# Patient Record
Sex: Female | Born: 1980 | Race: White | Hispanic: No | Marital: Married | Smoking: Never smoker
Health system: Southern US, Community
[De-identification: ages and names within clinical notes are randomized; demographics above are authoritative.]

## PROBLEM LIST (undated history)

## (undated) DIAGNOSIS — F411 Generalized anxiety disorder: Secondary | ICD-10-CM

## (undated) DIAGNOSIS — E785 Hyperlipidemia, unspecified: Secondary | ICD-10-CM

## (undated) DIAGNOSIS — B999 Unspecified infectious disease: Secondary | ICD-10-CM

## (undated) DIAGNOSIS — T148XXA Other injury of unspecified body region, initial encounter: Secondary | ICD-10-CM

## (undated) DIAGNOSIS — O139 Gestational [pregnancy-induced] hypertension without significant proteinuria, unspecified trimester: Secondary | ICD-10-CM

## (undated) DIAGNOSIS — I00 Rheumatic fever without heart involvement: Secondary | ICD-10-CM

## (undated) HISTORY — DX: Rheumatic fever without heart involvement: I00

## (undated) HISTORY — DX: Hyperlipidemia, unspecified: E78.5

## (undated) HISTORY — DX: Generalized anxiety disorder: F41.1

## (undated) HISTORY — PX: OTHER SURGICAL HISTORY: SHX169

---

## 2007-09-02 ENCOUNTER — Other Ambulatory Visit: Admission: RE | Admit: 2007-09-02 | Discharge: 2007-09-02 | Payer: Self-pay | Admitting: Family Medicine

## 2008-09-08 ENCOUNTER — Other Ambulatory Visit: Admission: RE | Admit: 2008-09-08 | Discharge: 2008-09-08 | Payer: Self-pay | Admitting: Family Medicine

## 2009-05-10 ENCOUNTER — Ambulatory Visit (HOSPITAL_COMMUNITY): Admission: RE | Admit: 2009-05-10 | Discharge: 2009-05-10 | Payer: Self-pay | Admitting: Family Medicine

## 2009-07-11 ENCOUNTER — Ambulatory Visit: Payer: Self-pay | Admitting: Occupational Medicine

## 2009-07-11 DIAGNOSIS — F411 Generalized anxiety disorder: Secondary | ICD-10-CM | POA: Insufficient documentation

## 2009-07-11 DIAGNOSIS — I499 Cardiac arrhythmia, unspecified: Secondary | ICD-10-CM | POA: Insufficient documentation

## 2009-07-19 ENCOUNTER — Encounter: Payer: Self-pay | Admitting: Cardiology

## 2009-07-19 ENCOUNTER — Ambulatory Visit: Payer: Self-pay | Admitting: Cardiology

## 2009-07-19 DIAGNOSIS — R002 Palpitations: Secondary | ICD-10-CM

## 2009-07-19 DIAGNOSIS — R0602 Shortness of breath: Secondary | ICD-10-CM

## 2009-07-19 DIAGNOSIS — R079 Chest pain, unspecified: Secondary | ICD-10-CM

## 2009-08-11 ENCOUNTER — Ambulatory Visit (HOSPITAL_COMMUNITY): Admission: RE | Admit: 2009-08-11 | Discharge: 2009-08-11 | Payer: Self-pay | Admitting: Cardiology

## 2009-08-11 ENCOUNTER — Encounter: Payer: Self-pay | Admitting: Cardiology

## 2009-08-11 ENCOUNTER — Ambulatory Visit: Payer: Self-pay

## 2009-08-11 ENCOUNTER — Ambulatory Visit: Payer: Self-pay | Admitting: Cardiovascular Disease

## 2010-04-04 NOTE — Assessment & Plan Note (Signed)
Summary: [facility]   Visit Type:  Initial Consult  CC:  palpitations.  History of Present Illness: 30 yo female for evaluation of palpitations. No prior cardiac history. She typically does not have dyspnea on exertion, orthopnea, PND, pedal edema, syncope or exertional chest pain. Over the past 3 weeks she has noticed intermittent palpitations. They are described as a brief flutter. There is no associated presyncope or syncope. There is no associated chest pain. She also runs routinely. She has noticed exercise intolerance over that time mild dyspnea. She also has had an occasional pain in the right upper chest described as a sharp sensation. It does not radiate. It is not pleuritic or positional nor is it related to food. It is not exertional. It lasts several minutes to one hour. It resolved spontaneously. She was seen at urgent care and we were asked to further evaluate.  Current Medications (verified): 1)  Womens Multivitamin Plus  Tabs (Multiple Vitamins-Minerals) .... Take 1 Tablet By Mouth Once A Day 2)  Fish Oil Maximum Strength 1200 Mg Caps (Omega-3 Fatty Acids) .... Take 1 Capsule By Mouth Two Times A Day  Allergies (verified): No Known Drug Allergies  Past History:  Past Medical History: ANXIETY STATE, UNSPECIFIED (ICD-300.00) Hyperlipidemia Saw cardiologist at [facility] as a child for possible rheumatic fever  Past Surgical History: Wisdom teeth removal  Family History: Reviewed history from 07/11/2009 and no changes required. Mother, Healthy Father, Diabetes, HTN, Hyperlipidemia No premature CAD in immediate family  Social History: Reviewed history from 07/11/2009 and no changes required. 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 Remaining systems are negative.    Vital Signs:  Patient profile:   30 year old female Height:      68 inches Weight:      160.50 pounds BMI:     24.49 Pulse rate:   51 / minute Pulse rhythm:   regular Resp:     18 per minute BP sitting:   124 / 72  (left arm) Cuff size:   regular  Vitals Entered By: Vikki Ports (Jul 19, 2009 3:04 PM)  Physical Exam  General:  Well developed/well nourished in NAD Skin warm/dry Patient not depressed No peripheral clubbing Back-normal HEENT-normal/normal eyelids Neck supple/normal carotid upstroke bilaterally; no bruits; no JVD; no thyromegaly chest - CTA/ normal expansion CV - RRR/normal S1 and S2; no murmurs, rubs or gallops;  PMI nondisplaced Abdomen -NT/ND, no HSM, no mass, + bowel sounds, no bruit 2+ femoral pulses, no bruits Ext-no edema, chords, 2+ DP Neuro-grossly nonfocal     EKG  Procedure date:  07/19/2009  Findings:      NSR, RV conduction delay, no ST changes, no delta wave.  Impression & Recommendations:  Problem # 1:  PALPITATIONS (ICD-785.1) Etiology unclear but  sound most likely to be related to PACs or PVCs. Schedule TSH, magnesium and potassium. If symptoms worsen will consider a CardioNet in the future.  Problem # 2:  DYSPNEA (ICD-786.05) Will schedule echocardiogram particularly with question of rheumatic fever previously.   Problem # 3:  CHEST PAIN (ICD-786.50) Symptoms atypical and not likely cardiac. No further evaluation at this time. Note no risk factors for pulmonary embolus.  Other Orders: T-Basic Metabolic Panel [id number]) T-Magnesium [phone]) T-TSH [phone]) T-Pregnancy (Serum), Qual.  [phone]) Echocardiogram (Echo)  Patient Instructions: 1)  Your physician recommends that you schedule a follow-up appointment in: AS NEEDED PENDING TEST RESULTS 2)  Your physician has requested that you have an echocardiogram.  Echocardiography is a painless test that uses  sound waves to create images of your heart. It provides your doctor with information about the size and shape of your heart and how well your heart's chambers and valves are working.  This procedure takes approximately one hour. There are no restrictions for this procedure.

## 2010-04-04 NOTE — Assessment & Plan Note (Signed)
Summary: IRREGULAR HEART BEAT/KH   Vital Signs:  Patient Profile:   30 Years Old Female CC:      Irregular heartbeat, HTN x 2 weeks Height:     68 inches Weight:      155 pounds O2 Sat:      100 % O2 treatment:    Room Air Temp:     98.4 degrees F oral Pulse rate:   64 / minute Pulse rhythm:   regular Resp:     12 per minute BP sitting:   154 / 92  (right arm) Cuff size:   regular  Vitals Entered By: Emilio Math (Jul 11, 2009 3:00 PM)                  Current Allergies: No known allergies History of Present Illness Chief Complaint: Irregular heartbeat, HTN x 2 weeks History of Present Illness: Very healthy athletic 30 year old presents with complaints of irregular heartbeat for the last 2 weeks.   [name] is very very anxious and tearful about her symptoms.   Says for the last 2 weeks [name] noted that her "heart skips" on occasion and Her "blood pressure has been high".   [name] ran a 1/2 marathon 4 months ago and runs regularly.   [name] denies sensations of racing heartbeat, dizziness, syncope or shortness of breath.   [name] has been having atypical chest pain that [name] describes as "sharp" intermittently.   Tums relieved the chest pain earlier.   [name] has not tried tums again.     [name] says [name] has a remote cardiac history in that [name] saw a cardiologist once because of concerns about rheumatic fever.   [name] was never followed up and never took any medications or had any special precautions recommended.   [name] is very anxious and "worried I am going to die".   [name] denies any unusual stresses in her life.   REVIEW OF SYSTEMS Constitutional Symptoms      Denies fever, chills, night sweats, weight loss, weight gain, and fatigue.  Eyes       Denies change in vision, eye pain, eye discharge, glasses, contact lenses, and eye surgery. Ear/Nose/Throat/Mouth       Denies hearing loss/aids, change in hearing, ear pain, ear discharge, dizziness, frequent runny nose, frequent nose bleeds, sinus  problems, sore throat, hoarseness, and tooth pain or bleeding.  Respiratory       Denies dry cough, productive cough, wheezing, shortness of breath, asthma, bronchitis, and emphysema/COPD.  Cardiovascular       Denies murmurs, chest pain, and tires easily with exhertion.      Comments: Chest tightness   Gastrointestinal       Denies stomach pain, nausea/vomiting, diarrhea, constipation, blood in bowel movements, and indigestion. Genitourniary       Denies painful urination, kidney stones, and loss of urinary control. Neurological       Denies paralysis, seizures, and fainting/blackouts. Musculoskeletal       Denies muscle pain, joint pain, joint stiffness, decreased range of motion, redness, swelling, muscle weakness, and gout.  Skin       Denies bruising, unusual mles/lumps or sores, and hair/skin or nail changes.  Psych       Denies mood changes, temper/anger issues, anxiety/stress, speech problems, depression, and sleep problems.  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 General appearance: well developed, well nourished, no acute distress Ears: normal, no lesions or deformities Nasal: mucosa pink, nonedematous, no septal deviation, turbinates normal Neck: neck supple,  trachea midline, no masses Chest/Lungs: no rales, wheezes, or rhonchi bilateral, breath sounds equal without effort Heart: regular rate and  rhythm, no murmur Assessment New Problems: SINUS ARRHYTHMIA (ICD-427.9) ANXIETY STATE, UNSPECIFIED (ICD-300.00)   The patient and/or caregiver has been counseled thoroughly with regard to medications prescribed including dosage, schedule, interactions, rationale for use, and possible side effects and they verbalize understanding.  Diagnoses and  expected course of recovery discussed and will return if not improved as expected or if the condition worsens. Patient and/or caregiver verbalized understanding.   Patient Instructions: 1)  EKG showed marked sinus arrhythmia with a rate of 64.  Otherwise normal 2)  [name] does not have an urgent cardiac condition so I will have her follow up with Dr. Jens Som on May 17 at 3:00. 3)  No medications are recommended at this time.  4)  Reassurance given.

## 2010-04-11 ENCOUNTER — Other Ambulatory Visit: Payer: Self-pay | Admitting: Family Medicine

## 2010-04-11 ENCOUNTER — Other Ambulatory Visit (HOSPITAL_COMMUNITY)
Admission: RE | Admit: 2010-04-11 | Discharge: 2010-04-11 | Disposition: A | Discharge status: Home / Self Care | Payer: Managed Care, Other (non HMO) | Source: Ambulatory Visit | Attending: Family Medicine | Admitting: Family Medicine

## 2010-04-11 DIAGNOSIS — Z124 Encounter for screening for malignant neoplasm of cervix: Secondary | ICD-10-CM | POA: Insufficient documentation

## 2010-10-19 ENCOUNTER — Encounter: Payer: Self-pay | Admitting: Cardiology

## 2011-02-05 IMAGING — NM NM BONE 3 PHASE
1 series · 6 of 6 positions shown · non-contrast
Comparison: None.

CLINICAL DATA: Left hip pain, runner

NUCLEAR MEDICINE THREE PHASE BONE SCAN
TECHNIQUE: Radionuclide angiographic images, immediate static
blood pool images, and 3-hour delayed static images were obtained
after intravenous injection of radiopharmaceutical.
Radiopharmaceutical: 24.0 mCi technetium 99

[Series 1: fl flow and static · 9.41mm/px · 6 of 40 frames shown]
[frame 4/40]
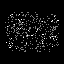
[frame 10/40  full-range]
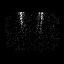
[frame 17/40  full-range]
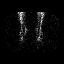
[frame 24/40  full-range]
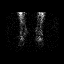
[frame 30/40  full-range]
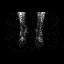
[frame 37/40  full-range]
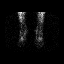

[6 of 6 positions shown; findings below may reference images not displayed]

FINDINGS: No asymmetric or increased flow to the left or right feet

No abnormal uptake on the blood pool phase.

No asymmetric or increased uptake on the delayed phase.
IMPRESSION: Normal three-phase bone scan of the feet.

## 2011-02-16 ENCOUNTER — Other Ambulatory Visit: Payer: Self-pay | Admitting: Surgery

## 2011-06-18 LAB — OB RESULTS CONSOLE GC/CHLAMYDIA: Gonorrhea: NEGATIVE

## 2011-07-19 LAB — OB RESULTS CONSOLE RUBELLA ANTIBODY, IGM: Rubella: IMMUNE

## 2011-07-19 LAB — OB RESULTS CONSOLE HEPATITIS B SURFACE ANTIGEN: Hepatitis B Surface Ag: NEGATIVE

## 2011-07-19 LAB — OB RESULTS CONSOLE ANTIBODY SCREEN: Antibody Screen: NEGATIVE

## 2011-07-19 LAB — OB RESULTS CONSOLE ABO/RH: RH Type: NEGATIVE

## 2012-01-24 LAB — OB RESULTS CONSOLE GBS: GBS: NEGATIVE

## 2012-02-18 ENCOUNTER — Encounter (HOSPITAL_COMMUNITY): Payer: Self-pay | Admitting: *Deleted

## 2012-02-18 ENCOUNTER — Inpatient Hospital Stay (HOSPITAL_COMMUNITY)
Admission: AD | Admit: 2012-02-18 | Discharge: 2012-02-18 | Disposition: A | Discharge status: Home / Self Care | Payer: BC Managed Care – PPO | Source: Ambulatory Visit | Attending: Obstetrics and Gynecology | Admitting: Obstetrics and Gynecology

## 2012-02-18 ENCOUNTER — Inpatient Hospital Stay (HOSPITAL_COMMUNITY): Payer: BC Managed Care – PPO

## 2012-02-18 DIAGNOSIS — O99891 Other specified diseases and conditions complicating pregnancy: Secondary | ICD-10-CM | POA: Insufficient documentation

## 2012-02-18 DIAGNOSIS — IMO0001 Reserved for inherently not codable concepts without codable children: Secondary | ICD-10-CM

## 2012-02-18 DIAGNOSIS — R030 Elevated blood-pressure reading, without diagnosis of hypertension: Secondary | ICD-10-CM | POA: Insufficient documentation

## 2012-02-18 LAB — COMPREHENSIVE METABOLIC PANEL
AST: 16 U/L (ref 0–37)
Albumin: 2.7 g/dL — ABNORMAL LOW (ref 3.5–5.2)
Alkaline Phosphatase: 122 U/L — ABNORMAL HIGH (ref 39–117)
BUN: 8 mg/dL (ref 6–23)
Calcium: 9.7 mg/dL (ref 8.4–10.5)
Creatinine, Ser: 0.6 mg/dL (ref 0.50–1.10)
GFR calc non Af Amer: >90 mL/min (ref >90)
Total Bilirubin: 0.2 mg/dL — ABNORMAL LOW (ref 0.3–1.2)
Total Protein: 6.2 g/dL (ref 6.0–8.3)

## 2012-02-18 LAB — URINALYSIS, ROUTINE W REFLEX MICROSCOPIC
Glucose, UA: NEGATIVE mg/dL
Hgb urine dipstick: NEGATIVE
Nitrite: NEGATIVE
Protein, ur: NEGATIVE mg/dL
Urobilinogen, UA: 0.2 mg/dL (ref 0.0–1.0)
pH: 5.5 (ref 5.0–8.0)

## 2012-02-18 LAB — CBC
HCT: 33.7 % — ABNORMAL LOW (ref 36.0–46.0)
Hemoglobin: 11.5 g/dL — ABNORMAL LOW (ref 12.0–15.0)
MCHC: 34.1 g/dL (ref 30.0–36.0)
RBC: 3.73 MIL/uL — ABNORMAL LOW (ref 3.87–5.11)

## 2012-02-18 LAB — URINE MICROSCOPIC-ADD ON

## 2012-02-18 LAB — PROTEIN / CREATININE RATIO, URINE
Creatinine, Urine: 19.8 mg/dL
Total Protein, Urine: <4 mg/dL

## 2012-02-18 NOTE — MAU Note (Signed)
Sent by MD of PIH eval

## 2012-02-18 NOTE — MAU Provider Note (Signed)
History     CSN: [id number]  Arrival date and time: 02/18/12 1728   None     Chief Complaint  Patient presents with  . Hypertension   HPI 31 y.o. G1P0 at 39 weeks sent from office for PIH eval, BP 140/90 there. Denies headache, vision change, abd pain. + fetal movement.   Past Medical History  Diagnosis Date  . Anxiety state     unspec  . HLD (hyperlipidemia)   . Rheumatic fever     saw cardiologist at [facility] as a child for possible rheumatic fever    Past Surgical History  Procedure Date  . Wisdom teeth removal   . No past surgeries     Family History  Problem Relation Age of Onset  . Hypertension Father   . Hyperlipidemia Father   . Diabetes Father     History  Substance Use Topics  . Smoking status: Unknown If Ever Smoked  . Smokeless tobacco: Not on file     Comment: non-smoker   . Alcohol Use: Yes    Allergies: No Known Allergies  Prescriptions prior to admission  Medication Sig Dispense Refill  . calcium carbonate (TUMS - DOSED IN MG ELEMENTAL CALCIUM) 500 MG chewable tablet Chew 1 tablet by mouth daily as needed. Heart burn      . docusate sodium (COLACE) 100 MG capsule Take 100 mg by mouth at bedtime.      . Prenatal Vit-Fe Fumarate-FA (PRENATAL MULTIVITAMIN) TABS Take 1 tablet by mouth at bedtime.        Review of Systems  Constitutional: Negative.   Eyes: Negative for blurred vision.  Respiratory: Negative.   Cardiovascular: Negative.   Gastrointestinal: Negative for nausea, vomiting, abdominal pain, diarrhea and constipation.  Genitourinary: Negative for dysuria, urgency, frequency, hematuria and flank pain.       Negative for vaginal bleeding, cramping/contractions  Musculoskeletal: Negative.   Neurological: Negative.  Negative for headaches.  Psychiatric/Behavioral: Negative.    Physical Exam   Blood pressure 125/81, pulse 64, temperature 98.5 F (36.9 C), temperature source Oral, resp. rate 16, height 5\' 6"  (1.676 m), weight 209  lb (94.802 kg).  Physical Exam  Nursing note and vitals reviewed. Constitutional: She is oriented to person, place, and time. She appears well-developed and well-nourished. No distress.  Cardiovascular: Normal rate.   Respiratory: Effort normal.  GI: Soft. There is no tenderness.  Musculoskeletal: Normal range of motion. She exhibits no edema.  Neurological: She is alert and oriented to person, place, and time. She has normal reflexes.  Skin: Skin is warm and dry.  Psychiatric: She has a normal mood and affect.    MAU Course  Procedures  Results for orders placed during the hospital encounter of 02/18/12 (from the past 72 hour(s))  URINALYSIS, ROUTINE W REFLEX MICROSCOPIC     Status: Abnormal   Collection Time   02/18/12  5:35 PM      Component Value Range Comment   Color, Urine STRAW (*) YELLOW    APPearance CLEAR  CLEAR    Specific Gravity, Urine <1.005 (*) 1.005 - 1.030    pH 5.5  5.0 - 8.0    Glucose, UA NEGATIVE  NEGATIVE mg/dL    Hgb urine dipstick NEGATIVE  NEGATIVE    Bilirubin Urine NEGATIVE  NEGATIVE    Ketones, ur NEGATIVE  NEGATIVE mg/dL    Protein, ur NEGATIVE  NEGATIVE mg/dL    Urobilinogen, UA 0.2  0.0 - 1.0 mg/dL    Nitrite NEGATIVE  NEGATIVE    Leukocytes, UA SMALL (*) NEGATIVE   URINE MICROSCOPIC-ADD ON     Status: Abnormal   Collection Time   02/18/12  5:35 PM      Component Value Range Comment   Squamous Epithelial / LPF MANY (*) RARE    WBC, UA 0-2  <3 WBC/hpf    RBC / HPF 0-2  <3 RBC/hpf    Bacteria, UA RARE  RARE   CBC     Status: Abnormal   Collection Time   02/18/12  6:25 PM      Component Value Range Comment   WBC 11.2 (*) 4.0 - 10.5 K/uL    RBC 3.73 (*) 3.87 - 5.11 MIL/uL    Hemoglobin 11.5 (*) 12.0 - 15.0 g/dL    HCT 47.8 (*) 29.5 - 46.0 %    MCV 90.3  78.0 - 100.0 fL    MCH 30.8  26.0 - 34.0 pg    MCHC 34.1  30.0 - 36.0 g/dL    RDW 62.1  30.8 - 65.7 %    Platelets 215  150 - 400 K/uL   COMPREHENSIVE METABOLIC PANEL     Status:  Abnormal   Collection Time   02/18/12  6:25 PM      Component Value Range Comment   Sodium 133 (*) 135 - 145 mEq/L    Potassium 3.8  3.5 - 5.1 mEq/L    Chloride 99  96 - 112 mEq/L    CO2 24  19 - 32 mEq/L    Glucose, Bld 73  70 - 99 mg/dL    BUN 8  6 - 23 mg/dL    Creatinine, Ser 8.46  0.50 - 1.10 mg/dL    Calcium 9.7  8.4 - 96.2 mg/dL    Total Protein 6.2  6.0 - 8.3 g/dL    Albumin 2.7 (*) 3.5 - 5.2 g/dL    AST 16  0 - 37 U/L    ALT 11  0 - 35 U/L    Alkaline Phosphatase 122 (*) 39 - 117 U/L    Total Bilirubin 0.2 (*) 0.3 - 1.2 mg/dL    GFR calc non Af Amer >90  >90 mL/min    GFR calc Af Amer >90  >90 mL/min    BPP 10/10 Assessment and Plan  Protein Creatinine ratio pending Care assumed by D. Charleen Kirks  [facility] 02/18/2012, 7:56 PM   C/W Dr. Renaldo Fiddler who reviewed labs (P:C ratio still pending)> Discharge home with preE precautions and F/U in office this week.  Danae Orleans, CNM 02/18/2012 8:52 PM

## 2012-02-22 ENCOUNTER — Inpatient Hospital Stay (HOSPITAL_COMMUNITY)
Admission: AD | Admit: 2012-02-22 | Discharge: 2012-02-25 | DRG: 371 | Disposition: A | Discharge status: Home / Self Care | Payer: BC Managed Care – PPO | Source: Ambulatory Visit | Attending: Obstetrics and Gynecology | Admitting: Obstetrics and Gynecology

## 2012-02-22 ENCOUNTER — Encounter (HOSPITAL_COMMUNITY): Payer: Self-pay | Admitting: *Deleted

## 2012-02-22 ENCOUNTER — Inpatient Hospital Stay (HOSPITAL_COMMUNITY): Payer: BC Managed Care – PPO

## 2012-02-22 DIAGNOSIS — I499 Cardiac arrhythmia, unspecified: Secondary | ICD-10-CM

## 2012-02-22 DIAGNOSIS — R079 Chest pain, unspecified: Secondary | ICD-10-CM

## 2012-02-22 DIAGNOSIS — F411 Generalized anxiety disorder: Secondary | ICD-10-CM

## 2012-02-22 DIAGNOSIS — R0602 Shortness of breath: Secondary | ICD-10-CM

## 2012-02-22 DIAGNOSIS — O139 Gestational [pregnancy-induced] hypertension without significant proteinuria, unspecified trimester: Secondary | ICD-10-CM | POA: Diagnosis present

## 2012-02-22 DIAGNOSIS — R002 Palpitations: Secondary | ICD-10-CM

## 2012-02-22 HISTORY — DX: Other injury of unspecified body region, initial encounter: T14.8XXA

## 2012-02-22 HISTORY — DX: Gestational (pregnancy-induced) hypertension without significant proteinuria, unspecified trimester: O13.9

## 2012-02-22 HISTORY — DX: Unspecified infectious disease: B99.9

## 2012-02-22 LAB — CBC
HCT: 35.1 % — ABNORMAL LOW (ref 36.0–46.0)
MCH: 30.5 pg (ref 26.0–34.0)
MCHC: 33.2 g/dL (ref 30.0–36.0)
MCV: 91.9 fL (ref 78.0–100.0)
Platelets: 204 10*3/uL (ref 150–400)
RBC: 4 MIL/uL (ref 3.87–5.11)

## 2012-02-22 LAB — COMPREHENSIVE METABOLIC PANEL
ALT: 12 U/L (ref 0–35)
Alkaline Phosphatase: 122 U/L — ABNORMAL HIGH (ref 39–117)
BUN: 9 mg/dL (ref 6–23)
CO2: 26 mEq/L (ref 19–32)
Calcium: 9.2 mg/dL (ref 8.4–10.5)
Chloride: 99 mEq/L (ref 96–112)
Creatinine, Ser: 0.68 mg/dL (ref 0.50–1.10)
GFR calc Af Amer: >90 mL/min (ref >90)
Glucose, Bld: 131 mg/dL — ABNORMAL HIGH (ref 70–99)
Total Bilirubin: 0.2 mg/dL — ABNORMAL LOW (ref 0.3–1.2)
Total Protein: 5.8 g/dL — ABNORMAL LOW (ref 6.0–8.3)

## 2012-02-22 LAB — URIC ACID: Uric Acid, Serum: 4.9 mg/dL (ref 2.4–7.0)

## 2012-02-22 LAB — TYPE AND SCREEN: ABO/RH(D): O NEG

## 2012-02-22 LAB — LACTATE DEHYDROGENASE: LDH: 134 U/L (ref 94–250)

## 2012-02-22 MED ORDER — TERBUTALINE SULFATE 1 MG/ML IJ SOLN: 0.2500 mg | Freq: Once | INTRAMUSCULAR | Status: AC | PRN

## 2012-02-22 MED ORDER — IBUPROFEN 600 MG PO TABS: 600.0000 mg | ORAL_TABLET | Freq: Four times a day (QID) | ORAL | Status: DC | PRN

## 2012-02-22 MED ORDER — LIDOCAINE HCL (PF) 1 % IJ SOLN: 30.0000 mL | INTRAMUSCULAR | Status: DC | PRN

## 2012-02-22 MED ORDER — PHENYLEPHRINE 40 MCG/ML (10ML) SYRINGE FOR IV PUSH (FOR BLOOD PRESSURE SUPPORT): 80.0000 ug | PREFILLED_SYRINGE | INTRAVENOUS | Status: DC | PRN

## 2012-02-22 MED ORDER — CITRIC ACID-SODIUM CITRATE 334-500 MG/5ML PO SOLN
30.0000 mL | ORAL | Status: DC | PRN
  Administered 2012-02-23: 30 mL via ORAL
  Filled 2012-02-22: qty 15

## 2012-02-22 MED ORDER — LACTATED RINGERS IV SOLN
INTRAVENOUS | Status: DC
  Administered 2012-02-22: 125 mL/h via INTRAVENOUS
  Administered 2012-02-22: 18:00:00 via INTRAVENOUS

## 2012-02-22 MED ORDER — OXYTOCIN 40 UNITS IN LACTATED RINGERS INFUSION - SIMPLE MED: 1.0000 m[IU]/min | INTRAVENOUS | Status: DC

## 2012-02-22 MED ORDER — ACETAMINOPHEN 325 MG PO TABS: 650.0000 mg | ORAL_TABLET | ORAL | Status: DC | PRN

## 2012-02-22 MED ORDER — LACTATED RINGERS IV SOLN: 500.0000 mL | Freq: Once | INTRAVENOUS | Status: DC

## 2012-02-22 MED ORDER — EPHEDRINE 5 MG/ML INJ: 10.0000 mg | INTRAVENOUS | Status: DC | PRN

## 2012-02-22 MED ORDER — OXYTOCIN 40 UNITS IN LACTATED RINGERS INFUSION - SIMPLE MED: 62.5000 mL/h | INTRAVENOUS | Status: DC

## 2012-02-22 MED ORDER — LACTATED RINGERS IV SOLN: 500.0000 mL | INTRAVENOUS | Status: DC | PRN

## 2012-02-22 MED ORDER — DIPHENHYDRAMINE HCL 50 MG/ML IJ SOLN: 12.5000 mg | INTRAMUSCULAR | Status: DC | PRN

## 2012-02-22 MED ORDER — EPHEDRINE 5 MG/ML INJ
10.0000 mg | INTRAVENOUS | Status: DC | PRN
  Filled 2012-02-22: qty 4

## 2012-02-22 MED ORDER — MISOPROSTOL 25 MCG QUARTER TABLET
25.0000 ug | ORAL_TABLET | ORAL | Status: DC | PRN
  Administered 2012-02-22: 25 ug via VAGINAL
  Filled 2012-02-22: qty 0.25

## 2012-02-22 MED ORDER — OXYTOCIN BOLUS FROM INFUSION: 500.0000 mL | INTRAVENOUS | Status: DC

## 2012-02-22 MED ORDER — PHENYLEPHRINE 40 MCG/ML (10ML) SYRINGE FOR IV PUSH (FOR BLOOD PRESSURE SUPPORT)
80.0000 ug | PREFILLED_SYRINGE | INTRAVENOUS | Status: DC | PRN
  Filled 2012-02-22: qty 5

## 2012-02-22 MED ORDER — OXYCODONE-ACETAMINOPHEN 5-325 MG PO TABS: 1.0000 | ORAL_TABLET | ORAL | Status: DC | PRN

## 2012-02-22 MED ORDER — ACETAMINOPHEN 325 MG PO TABS
650.0000 mg | ORAL_TABLET | Freq: Once | ORAL | Status: AC
  Administered 2012-02-22: 650 mg via ORAL
  Filled 2012-02-22: qty 2

## 2012-02-22 MED ORDER — ONDANSETRON HCL 4 MG/2ML IJ SOLN: 4.0000 mg | Freq: Four times a day (QID) | INTRAMUSCULAR | Status: DC | PRN

## 2012-02-22 MED ORDER — BUTORPHANOL TARTRATE 1 MG/ML IJ SOLN
1.0000 mg | INTRAMUSCULAR | Status: DC | PRN
  Administered 2012-02-22 – 2012-02-23 (×2): 1 mg via INTRAVENOUS
  Filled 2012-02-22 (×2): qty 1

## 2012-02-22 MED ORDER — ZOLPIDEM TARTRATE 5 MG PO TABS: 5.0000 mg | ORAL_TABLET | Freq: Every evening | ORAL | Status: DC | PRN

## 2012-02-22 MED ORDER — FENTANYL 2.5 MCG/ML BUPIVACAINE 1/10 % EPIDURAL INFUSION (WH - ANES)
14.0000 mL/h | INTRAMUSCULAR | Status: DC
  Filled 2012-02-22: qty 125

## 2012-02-22 NOTE — Progress Notes (Signed)
MD on the phone and notified FHR tracing baseline115 reactive and reassuring. Pt requesting to eat and MD stated pt could order dinner. Will continue to monitor.

## 2012-02-22 NOTE — MAU Note (Signed)
Sent from office for PIH eval, BP up, labs ok on Mon, has HA  And increased swelling.today, no visual changes or epigastric pain.

## 2012-02-22 NOTE — MAU Provider Note (Signed)
History     CSN: [id number]  Arrival date and time: 02/22/12 1347   First Provider Initiated Contact with Patient 02/22/12 1435      No chief complaint on file.  HPI  Pt is G1P0 @ [redacted]w[redacted]d  Here for PIH evaluation.  She was seen in the office on Monday and sent over for PIH eval on 12/16.  Pt has been taking BP at work and has been elevated diastolic in 90s.  Today she has a mild headache and some mild cramping; she denies visual changes or epigastric pain.  She was seen in the office today with elevated BP and was told she would come for PIH eval and probable induction.  Pt denies spotting, bleeding or LOF.  Past Medical History  Diagnosis Date  . Anxiety state     unspec  . HLD (hyperlipidemia)   . Rheumatic fever     saw cardiologist at [facility] as a child for possible rheumatic fever  . Pregnancy induced hypertension   . Infection     uti  . Fracture     ankle    Past Surgical History  Procedure Date  . Wisdom teeth removal   . No past surgeries     Family History  Problem Relation Age of Onset  . Hypertension Father   . Hyperlipidemia Father   . Diabetes Father   . Other Neg Hx     History  Substance Use Topics  . Smoking status: Never Smoker   . Smokeless tobacco: Never Used     Comment: non-smoker   . Alcohol Use: Yes     Comment: none with preg    Allergies: No Known Allergies  Prescriptions prior to admission  Medication Sig Dispense Refill  . acetaminophen (TYLENOL) 500 MG tablet Take 1,000 mg by mouth daily as needed. headache      . calcium carbonate (TUMS - DOSED IN MG ELEMENTAL CALCIUM) 500 MG chewable tablet Chew 1-2 tablets by mouth daily as needed. Heart burn      . docusate sodium (COLACE) 100 MG capsule Take 100 mg by mouth at bedtime.      . Prenatal Vit-Fe Fumarate-FA (PRENATAL MULTIVITAMIN) TABS Take 1 tablet by mouth at bedtime.        Review of Systems  Constitutional: Negative for fever and chills.  Gastrointestinal: Positive for  abdominal pain. Negative for nausea, vomiting, diarrhea and constipation.  Genitourinary: Negative for dysuria and urgency.   Physical Exam   Blood pressure 142/73, pulse 82, temperature 97 F (36.1 C), temperature source Oral, resp. rate 18.  Physical Exam  Vitals reviewed. Constitutional: She is oriented to person, place, and time. She appears well-developed and well-nourished. No distress.  HENT:  Head: Normocephalic.  Eyes: Pupils are equal, round, and reactive to light.  Neck: Normal range of motion. Neck supple.  Cardiovascular: Normal rate.   Respiratory: Effort normal.  GI: Soft. She exhibits no distension. There is no tenderness. There is no rebound and no guarding.       Uterine irritability noted; FHR reactive with baseline 150- pt had 2 variables  Musculoskeletal: Normal range of motion. She exhibits edema.       2-3+pitting edema in ankles  Neurological: She is alert and oriented to person, place, and time.  Skin: Skin is warm and dry.  Psychiatric: She has a normal mood and affect.    MAU Course  Procedures Tylenol ordered for headache Results for orders placed during the hospital encounter of  02/22/12 (from the past 24 hour(s))  CBC     Status: Abnormal   Collection Time   02/22/12  2:00 PM      Component Value Range   WBC 13.0 (*) 4.0 - 10.5 K/uL   RBC 3.82 (*) 3.87 - 5.11 MIL/uL   Hemoglobin 11.7 (*) 12.0 - 15.0 g/dL   HCT 16.1 (*) 09.6 - 04.5 %   MCV 91.9  78.0 - 100.0 fL   MCH 30.6  26.0 - 34.0 pg   MCHC 33.3  30.0 - 36.0 g/dL   RDW 40.9  81.1 - 91.4 %   Platelets 195  150 - 400 K/uL  COMPREHENSIVE METABOLIC PANEL     Status: Abnormal   Collection Time   02/22/12  2:00 PM      Component Value Range   Sodium 135  135 - 145 mEq/L   Potassium 4.1  3.5 - 5.1 mEq/L   Chloride 99  96 - 112 mEq/L   CO2 26  19 - 32 mEq/L   Glucose, Bld 131 (*) 70 - 99 mg/dL   BUN 9  6 - 23 mg/dL   Creatinine, Ser 7.82  0.50 - 1.10 mg/dL   Calcium 9.2  8.4 - 95.6 mg/dL     Total Protein 5.8 (*) 6.0 - 8.3 g/dL   Albumin 2.6 (*) 3.5 - 5.2 g/dL   AST 16  0 - 37 U/L   ALT 12  0 - 35 U/L   Alkaline Phosphatase 122 (*) 39 - 117 U/L   Total Bilirubin 0.2 (*) 0.3 - 1.2 mg/dL   GFR calc non Af Amer >90  >90 mL/min   GFR calc Af Amer >90  >90 mL/min  LACTATE DEHYDROGENASE     Status: Normal   Collection Time   02/22/12  2:00 PM      Component Value Range   LDH 134  94 - 250 U/L  URIC ACID     Status: Normal   Collection Time   02/22/12  2:00 PM      Component Value Range   Uric Acid, Serum 4.9  2.4 - 7.0 mg/dL  discussed labs with Dr. Henderson Cloud and fetal monitor strip- pt had one variable dec that lasted about 1 minute- will get BPP  Dr. Henderson Cloud here to see pt Assessment and Plan  Agree with above. After BPP of 8/8 and vertex was noted, FHT had another decel of about 1 minute. D/W patient and husband I believe she has evolving PIH and also repetitive decels Recommend delivery D/W patient and husband two stage induction process, length of process, risks including fetal distress, emergency cesarean section, failed induction and cesarean section. All questions answered.  LINEBERRY,SUSAN 02/22/2012, 3:07 PM

## 2012-02-22 NOTE — Progress Notes (Signed)
MD notified of FHR and decelerations. MD stated to not let pt eat at this time. Will continue to monitor.

## 2012-02-22 NOTE — Progress Notes (Signed)
SROM clear Cx 1/50/-2 per nurse check FHT-a decel noted earlier on a trip to [place]        Now FHT reactive BPs stable

## 2012-02-23 ENCOUNTER — Encounter (HOSPITAL_COMMUNITY): Payer: Self-pay | Admitting: Anesthesiology

## 2012-02-23 ENCOUNTER — Encounter (HOSPITAL_COMMUNITY): Payer: Self-pay | Admitting: Family Medicine

## 2012-02-23 ENCOUNTER — Inpatient Hospital Stay (HOSPITAL_COMMUNITY): Payer: BC Managed Care – PPO | Admitting: Anesthesiology

## 2012-02-23 ENCOUNTER — Encounter (HOSPITAL_COMMUNITY)
Admission: AD | Disposition: A | Discharge status: Home / Self Care | Payer: Self-pay | Source: Ambulatory Visit | Attending: Obstetrics and Gynecology

## 2012-02-23 LAB — CBC
MCH: 30.7 pg (ref 26.0–34.0)
MCHC: 33.8 g/dL (ref 30.0–36.0)
MCV: 90.8 fL (ref 78.0–100.0)
Platelets: 198 10*3/uL (ref 150–400)
RBC: 3.81 MIL/uL — ABNORMAL LOW (ref 3.87–5.11)
RDW: 13.2 % (ref 11.5–15.5)

## 2012-02-23 SURGERY — Surgical Case
Anesthesia: Spinal | Wound class: Clean Contaminated

## 2012-02-23 MED ORDER — EPHEDRINE SULFATE 50 MG/ML IJ SOLN
INTRAMUSCULAR | Status: DC | PRN
  Administered 2012-02-23: 10 mg via INTRAVENOUS

## 2012-02-23 MED ORDER — DIPHENHYDRAMINE HCL 50 MG/ML IJ SOLN: 12.5000 mg | INTRAMUSCULAR | Status: DC | PRN

## 2012-02-23 MED ORDER — SCOPOLAMINE 1 MG/3DAYS TD PT72
1.0000 | MEDICATED_PATCH | Freq: Once | TRANSDERMAL | Status: DC
  Administered 2012-02-23: 1.5 mg via TRANSDERMAL

## 2012-02-23 MED ORDER — CEFAZOLIN SODIUM-DEXTROSE 2-3 GM-% IV SOLR: 2.0000 g | INTRAVENOUS | Status: DC

## 2012-02-23 MED ORDER — CEFAZOLIN SODIUM-DEXTROSE 2-3 GM-% IV SOLR
INTRAVENOUS | Status: DC | PRN
  Administered 2012-02-23: 2 g via INTRAVENOUS

## 2012-02-23 MED ORDER — KETOROLAC TROMETHAMINE 30 MG/ML IJ SOLN: 30.0000 mg | Freq: Four times a day (QID) | INTRAMUSCULAR | Status: AC | PRN

## 2012-02-23 MED ORDER — LACTATED RINGERS IV SOLN
INTRAVENOUS | Status: DC | PRN
  Administered 2012-02-23: 02:00:00 via INTRAVENOUS

## 2012-02-23 MED ORDER — OXYTOCIN 40 UNITS IN LACTATED RINGERS INFUSION - SIMPLE MED: 62.5000 mL/h | INTRAVENOUS | Status: AC

## 2012-02-23 MED ORDER — PRENATAL MULTIVITAMIN CH
1.0000 | ORAL_TABLET | Freq: Every day | ORAL | Status: DC
  Administered 2012-02-23 – 2012-02-25 (×3): 1 via ORAL
  Filled 2012-02-23 (×3): qty 1

## 2012-02-23 MED ORDER — SIMETHICONE 80 MG PO CHEW: 80.0000 mg | CHEWABLE_TABLET | ORAL | Status: DC | PRN

## 2012-02-23 MED ORDER — NALBUPHINE HCL 10 MG/ML IJ SOLN
5.0000 mg | INTRAMUSCULAR | Status: DC | PRN
  Filled 2012-02-23: qty 1

## 2012-02-23 MED ORDER — DIPHENHYDRAMINE HCL 25 MG PO CAPS: 25.0000 mg | ORAL_CAPSULE | ORAL | Status: DC | PRN

## 2012-02-23 MED ORDER — IBUPROFEN 600 MG PO TABS
600.0000 mg | ORAL_TABLET | Freq: Four times a day (QID) | ORAL | Status: DC
  Administered 2012-02-23 – 2012-02-25 (×7): 600 mg via ORAL
  Filled 2012-02-23: qty 1

## 2012-02-23 MED ORDER — ONDANSETRON HCL 4 MG/2ML IJ SOLN
INTRAMUSCULAR | Status: AC
  Filled 2012-02-23: qty 2

## 2012-02-23 MED ORDER — SIMETHICONE 80 MG PO CHEW
80.0000 mg | CHEWABLE_TABLET | Freq: Three times a day (TID) | ORAL | Status: DC
  Administered 2012-02-23 – 2012-02-25 (×9): 80 mg via ORAL

## 2012-02-23 MED ORDER — ONDANSETRON HCL 4 MG PO TABS: 4.0000 mg | ORAL_TABLET | ORAL | Status: DC | PRN

## 2012-02-23 MED ORDER — PHENYLEPHRINE 40 MCG/ML (10ML) SYRINGE FOR IV PUSH (FOR BLOOD PRESSURE SUPPORT)
PREFILLED_SYRINGE | INTRAVENOUS | Status: AC
  Filled 2012-02-23: qty 5

## 2012-02-23 MED ORDER — FENTANYL CITRATE 0.05 MG/ML IJ SOLN
INTRAMUSCULAR | Status: AC
  Filled 2012-02-23: qty 2

## 2012-02-23 MED ORDER — LANOLIN HYDROUS EX OINT: 1.0000 "application " | TOPICAL_OINTMENT | CUTANEOUS | Status: DC | PRN

## 2012-02-23 MED ORDER — KETOROLAC TROMETHAMINE 60 MG/2ML IM SOLN
INTRAMUSCULAR | Status: AC
  Filled 2012-02-23: qty 2

## 2012-02-23 MED ORDER — EPHEDRINE 5 MG/ML INJ
INTRAVENOUS | Status: AC
  Filled 2012-02-23: qty 10

## 2012-02-23 MED ORDER — DIPHENHYDRAMINE HCL 50 MG/ML IJ SOLN: 25.0000 mg | INTRAMUSCULAR | Status: DC | PRN

## 2012-02-23 MED ORDER — LACTATED RINGERS IV SOLN
INTRAVENOUS | Status: DC
  Administered 2012-02-23: 08:00:00 via INTRAVENOUS

## 2012-02-23 MED ORDER — OXYCODONE-ACETAMINOPHEN 5-325 MG PO TABS
1.0000 | ORAL_TABLET | ORAL | Status: DC | PRN
  Administered 2012-02-24 – 2012-02-25 (×6): 1 via ORAL
  Filled 2012-02-23 (×6): qty 1

## 2012-02-23 MED ORDER — KETOROLAC TROMETHAMINE 60 MG/2ML IM SOLN
60.0000 mg | Freq: Once | INTRAMUSCULAR | Status: AC | PRN
  Administered 2012-02-23: 60 mg via INTRAMUSCULAR

## 2012-02-23 MED ORDER — HYDROMORPHONE HCL PF 1 MG/ML IJ SOLN: 0.2500 mg | INTRAMUSCULAR | Status: DC | PRN

## 2012-02-23 MED ORDER — SCOPOLAMINE 1 MG/3DAYS TD PT72
MEDICATED_PATCH | TRANSDERMAL | Status: AC
  Filled 2012-02-23: qty 1

## 2012-02-23 MED ORDER — NALOXONE HCL 1 MG/ML IJ SOLN
1.0000 ug/kg/h | INTRAVENOUS | Status: DC | PRN
  Filled 2012-02-23: qty 2

## 2012-02-23 MED ORDER — MEPERIDINE HCL 25 MG/ML IJ SOLN: 6.2500 mg | INTRAMUSCULAR | Status: DC | PRN

## 2012-02-23 MED ORDER — TETANUS-DIPHTH-ACELL PERTUSSIS 5-2.5-18.5 LF-MCG/0.5 IM SUSP: 0.5000 mL | Freq: Once | INTRAMUSCULAR | Status: DC

## 2012-02-23 MED ORDER — SENNOSIDES-DOCUSATE SODIUM 8.6-50 MG PO TABS
2.0000 | ORAL_TABLET | Freq: Every day | ORAL | Status: DC
  Administered 2012-02-23 – 2012-02-24 (×2): 2 via ORAL

## 2012-02-23 MED ORDER — ZOLPIDEM TARTRATE 5 MG PO TABS: 5.0000 mg | ORAL_TABLET | Freq: Every evening | ORAL | Status: DC | PRN

## 2012-02-23 MED ORDER — METOCLOPRAMIDE HCL 5 MG/ML IJ SOLN: 10.0000 mg | Freq: Three times a day (TID) | INTRAMUSCULAR | Status: DC | PRN

## 2012-02-23 MED ORDER — WITCH HAZEL-GLYCERIN EX PADS: 1.0000 "application " | MEDICATED_PAD | CUTANEOUS | Status: DC | PRN

## 2012-02-23 MED ORDER — DIBUCAINE 1 % RE OINT: 1.0000 "application " | TOPICAL_OINTMENT | RECTAL | Status: DC | PRN

## 2012-02-23 MED ORDER — IBUPROFEN 600 MG PO TABS
600.0000 mg | ORAL_TABLET | Freq: Four times a day (QID) | ORAL | Status: DC | PRN
  Filled 2012-02-23 (×6): qty 1

## 2012-02-23 MED ORDER — NALOXONE HCL 0.4 MG/ML IJ SOLN: 0.4000 mg | INTRAMUSCULAR | Status: DC | PRN

## 2012-02-23 MED ORDER — MORPHINE SULFATE 0.5 MG/ML IJ SOLN
INTRAMUSCULAR | Status: AC
  Filled 2012-02-23: qty 10

## 2012-02-23 MED ORDER — ONDANSETRON HCL 4 MG/2ML IJ SOLN: 4.0000 mg | INTRAMUSCULAR | Status: DC | PRN

## 2012-02-23 MED ORDER — OXYTOCIN 10 UNIT/ML IJ SOLN
INTRAMUSCULAR | Status: AC
  Filled 2012-02-23: qty 4

## 2012-02-23 MED ORDER — ONDANSETRON HCL 4 MG/2ML IJ SOLN: 4.0000 mg | Freq: Three times a day (TID) | INTRAMUSCULAR | Status: DC | PRN

## 2012-02-23 MED ORDER — DIPHENHYDRAMINE HCL 25 MG PO CAPS: 25.0000 mg | ORAL_CAPSULE | Freq: Four times a day (QID) | ORAL | Status: DC | PRN

## 2012-02-23 MED ORDER — MENTHOL 3 MG MT LOZG: 1.0000 | LOZENGE | OROMUCOSAL | Status: DC | PRN

## 2012-02-23 MED ORDER — GLYCOPYRROLATE 0.2 MG/ML IJ SOLN
INTRAMUSCULAR | Status: DC | PRN
  Administered 2012-02-23: 0.2 mg via INTRAVENOUS

## 2012-02-23 MED ORDER — ONDANSETRON HCL 4 MG/2ML IJ SOLN
INTRAMUSCULAR | Status: DC | PRN
  Administered 2012-02-23: 4 mg via INTRAVENOUS

## 2012-02-23 MED ORDER — OXYTOCIN 10 UNIT/ML IJ SOLN
40.0000 [IU] | INTRAVENOUS | Status: DC | PRN
  Administered 2012-02-23: 40 [IU] via INTRAVENOUS

## 2012-02-23 MED ORDER — SODIUM CHLORIDE 0.9 % IJ SOLN
3.0000 mL | INTRAMUSCULAR | Status: DC | PRN
  Administered 2012-02-23: 3 mL via INTRAVENOUS

## 2012-02-23 SURGICAL SUPPLY — 33 items
BENZOIN TINCTURE PRP APPL 2/3 (GAUZE/BANDAGES/DRESSINGS) ×2 IMPLANT
CLOTH BEACON ORANGE TIMEOUT ST (SAFETY) ×2 IMPLANT
CONTAINER PREFILL 10% NBF 15ML (MISCELLANEOUS) IMPLANT
DERMABOND ADVANCED (GAUZE/BANDAGES/DRESSINGS)
DERMABOND ADVANCED .7 DNX12 (GAUZE/BANDAGES/DRESSINGS) IMPLANT
DRAPE LG THREE QUARTER DISP (DRAPES) ×2 IMPLANT
DRESSING TELFA 8X3 (GAUZE/BANDAGES/DRESSINGS) IMPLANT
DRSG OPSITE 11X17.75 LRG (GAUZE/BANDAGES/DRESSINGS) ×2 IMPLANT
DRSG OPSITE POSTOP 4X10 (GAUZE/BANDAGES/DRESSINGS) ×2 IMPLANT
DURAPREP 26ML APPLICATOR (WOUND CARE) ×2 IMPLANT
ELECT REM PT RETURN 9FT ADLT (ELECTROSURGICAL) ×2
ELECTRODE REM PT RTRN 9FT ADLT (ELECTROSURGICAL) ×1 IMPLANT
EXTRACTOR VACUUM M CUP 4 TUBE (SUCTIONS) IMPLANT
GAUZE SPONGE 4X4 12PLY STRL LF (GAUZE/BANDAGES/DRESSINGS) ×4 IMPLANT
GLOVE BIO SURGEON STRL SZ8 (GLOVE) ×4 IMPLANT
GOWN PREVENTION PLUS LG XLONG (DISPOSABLE) ×2 IMPLANT
KIT ABG SYR 3ML LUER SLIP (SYRINGE) ×2 IMPLANT
NEEDLE HYPO 25X5/8 SAFETYGLIDE (NEEDLE) ×2 IMPLANT
NS IRRIG 1000ML POUR BTL (IV SOLUTION) ×2 IMPLANT
PACK C SECTION WH (CUSTOM PROCEDURE TRAY) ×2 IMPLANT
PAD ABD 7.5X8 STRL (GAUZE/BANDAGES/DRESSINGS) IMPLANT
PAD OB MATERNITY 4.3X12.25 (PERSONAL CARE ITEMS) ×2 IMPLANT
SLEEVE SCD COMPRESS KNEE MED (MISCELLANEOUS) ×2 IMPLANT
STAPLER VISISTAT 35W (STAPLE) IMPLANT
STRIP CLOSURE SKIN 1/2X4 (GAUZE/BANDAGES/DRESSINGS) IMPLANT
SUT MNCRL 0 VIOLET CTX 36 (SUTURE) ×4 IMPLANT
SUT MONOCRYL 0 CTX 36 (SUTURE) ×4
SUT PDS AB 0 CTX 60 (SUTURE) ×2 IMPLANT
SUT PLAIN 0 NONE (SUTURE) IMPLANT
SUT VIC AB 4-0 KS 27 (SUTURE) ×2 IMPLANT
TOWEL OR 17X24 6PK STRL BLUE (TOWEL DISPOSABLE) ×6 IMPLANT
TRAY FOLEY CATH 14FR (SET/KITS/TRAYS/PACK) ×2 IMPLANT
WATER STERILE IRR 1000ML POUR (IV SOLUTION) ×2 IMPLANT

## 2012-02-23 NOTE — Progress Notes (Signed)
FHT baseline 110, repetitive variable decels getting deeper now into 80s with slow recovery  Cx 2/90/-2 no change  UCs q2-3 min after one cytotec  A: Non reassuring FHT  P: recommend cesarean section     D/W patient and husband risks including infection, organ damage, bleeding/transfusion-HIV/Hep, DVT/PE, pneumonia. All questions answered.

## 2012-02-23 NOTE — Transfer of Care (Signed)
Immediate Anesthesia Transfer of Care Note  Patient: [name]  Procedure(s) Performed: Procedure(s) (LRB) with comments: CESAREAN SECTION (N/A) - Primary Cesarean Section  Patient Location: PACU  Anesthesia Type:Spinal  Level of Consciousness: awake, alert  and oriented  Airway & Oxygen Therapy: Patient Spontanous Breathing  Post-op Assessment: Report given to PACU RN and Post -op Vital signs reviewed and stable  Post vital signs: Reviewed and stable  Complications: No apparent anesthesia complications

## 2012-02-23 NOTE — Consult Note (Signed)
Neonatology Note:  Attendance at C-section:  I was asked to attend this primary C/S at term due to NRFHR. The mother is a G1P0 O neg, GBS neg with PIH, being induced. ROM 4 hours prior to delivery, fluid clear. Infant vigorous with good spontaneous cry and tone. Needed only minimal bulb suctioning. Ap 9/9. Lungs clear to ausc in DR. To CN to care of Pediatrician.  Anyra Kaufman, MD  

## 2012-02-23 NOTE — Progress Notes (Signed)
MD discussed risks and benefits of cesarean section delivery. Informed consents signed.

## 2012-02-23 NOTE — Anesthesia Postprocedure Evaluation (Signed)
  Anesthesia Post-op Note  Patient: [name]  Procedure(s) Performed: Procedure(s) (LRB) with comments: CESAREAN SECTION (N/A) - Primary Cesarean Section  Patient is awake, responsive, moving her legs, and has signs of resolution of her numbness. Pain and nausea are reasonably well controlled. Vital signs are stable and clinically acceptable. Oxygen saturation is clinically acceptable. There are no apparent anesthetic complications at this time. Patient is ready for discharge.

## 2012-02-23 NOTE — Op Note (Signed)
NAMEKERRYANN, [name] NO.:  [id number]  MEDICAL RECORD NO.:  [id number]  LOCATION:  9114                          FACILITY:  [facility]  PHYSICIAN:  Guy Sandifer. Henderson Cloud, M.D. DATE OF BIRTH:  [birth year]  DATE OF PROCEDURE:  02/23/2012 DATE OF DISCHARGE:                              OPERATIVE REPORT   PREOPERATIVE DIAGNOSES: 1. Intrauterine pregnancy at 40-0/7th weeks. 2. Pregnancy-induced hypertension. 3. Nonreassuring fetal heart tracing.  POSTOPERATIVE DIAGNOSES: 1. Intrauterine pregnancy at 40-0/7th weeks. 2. Pregnancy-induced hypertension. 3. Nonreassuring fetal heart tracing.  PROCEDURE:  Low-transverse cesarean section.  SURGEON:  Guy Sandifer. Henderson Cloud, MD  ANESTHESIA:  Spinal, Jean Rosenthal MD  ESTIMATED BLOOD LOSS:  600 mL.  FINDINGS:  Viable female infant, Apgars of 9 at 1 minute, 5 minutes, not yet recorded.  Arterial cord pH 7.36, weight pending.  SPECIMENS:  Placenta to Pathology.  INDICATIONS AND CONSENT:  This patient is a 31 year old married white female, G1, P0, who was admitted yesterday because of increasingly labile blood pressures for pregnancy-induced hypertension and induction of labor.  Cervix was closed and thick on admission.  Laboratories are normal for pregnancy and blood pressures remained stable.  After one application of Cytotec, she begins contracting and had spontaneous rupture of membranes for clear fluid.  She continues to contract regularly.  The baseline fetal heart rate was noted to decrease from 120- 130s to, 105-110 range.  The repetitive variable deceleration was increasingly deep to the 80s with slow recoveries.  The patient progresses to 2 cm dilation, 90% effaced, -2 station.  However over 1-2 hours, she makes no progress.  Due to the anticipated length of pregnancy with nonreassuring fetal heart tracing, cesarean section was recommended.  Potential risks and complications were discussed with the patient and her husband  preoperatively including not limited to, infection, organ damage, bleeding requiring transfusion of blood products with HIV and hepatitis acquisition, DVT, PE, pneumonia.  All questions were answered and consent was signed on the chart.  PROCEDURE IN DETAIL:  The patient was taken to the operating room, where she was identified, spinal anesthetic was placed, and she was placed in dorsal supine position with a 15 degree left lateral wedge.  Fetal heart rate was obtained in the 80s.  The patient was then prepped with Betadine.  Foley catheter was placed and the bladder was drained.  She was draped in the sterile fashion.  Time-out undertaken.  After testing for adequate spinal anesthesia, skin was entered through a Pfannenstiel incision and dissection is carried out in layers of the peritoneum. Peritoneum was incised and extended superiorly and inferiorly. Vesicouterine peritoneum was taken down cephalolaterally with the fingers, and a bladder flap was developed.  The bladder blade was placed.  Uterus was incised in a low transverse manner.  The uterine cavity was entered bluntly with a hemostat.  The uterine incision was extended cephalolaterally with the fingers.  Vertex was delivered. Remainder of the baby was delivered without difficulty.  Good cry and tone was noted.  Cord was clamped and cut.  The baby was handed to the awaiting pediatrics team.  Placenta was manually delivered intact and sent to Pathology.  Uterine cavity was clean.  Uterus was closed in 2 running locking imbricating layers of 0 Monocryl suture which achieves good hemostasis.  The anterior peritoneum was closed in a running fashion with 0 Monocryl suture which was also used to reapproximate the pyramidalis muscle in the midline.  Anterior rectus fascia was closed in running fashion with a 0 looped PDS suture.  Subcutaneous tissues were reapproximated with plain sutures and the skin was closed with a 4-0 Vicryl on a  Keith needle.  Dressings were applied.  All counts correct. The patient was transferred recovery room in stable condition.     Guy Sandifer Henderson Cloud, M.D.     JET/MEDQ  D:  02/23/2012  T:  02/23/2012  Job:  [id number]

## 2012-02-23 NOTE — Anesthesia Preprocedure Evaluation (Deleted)
Anesthesia Evaluation  Patient identified by MRN, date of birth, ID band Patient awake    Reviewed: Allergy & Precautions, H&P , Patient's Chart, lab work & pertinent test results  Airway Mallampati: II TM Distance: >3 FB Neck ROM: full    Dental  (+) Teeth Intact   Pulmonary  breath sounds clear to auscultation        Cardiovascular Rhythm:regular Rate:Normal     Neuro/Psych    GI/Hepatic   Endo/Other    Renal/GU      Musculoskeletal   Abdominal   Peds  Hematology   Anesthesia Other Findings       Reproductive/Obstetrics (+) Pregnancy                           Anesthesia Physical Anesthesia Plan  ASA: III  Anesthesia Plan: Spinal   Post-op Pain Management:    Induction:   Airway Management Planned:   Additional Equipment:   Intra-op Plan:   Post-operative Plan:   Informed Consent: I have reviewed the patients History and Physical, chart, labs and discussed the procedure including the risks, benefits and alternatives for the proposed anesthesia with the patient or authorized representative who has indicated his/her understanding and acceptance.   Dental Advisory Given  Plan Discussed with:   Anesthesia Plan Comments: (Labs checked- platelets confirmed with CRNA in room. Discussed spinal, and patient consents to the procedure:  included risk of possible headache,backache, failed block, allergic reaction, and nerve injury. This patient was asked if she had any questions or concerns before the procedure started. )       Anesthesia Quick Evaluation

## 2012-02-23 NOTE — Progress Notes (Signed)
Inserted by Dr Henderson Cloud

## 2012-02-23 NOTE — Anesthesia Postprocedure Evaluation (Signed)
  Anesthesia Post-op Note  Patient: [name]  Procedure(s) Performed: Procedure(s) (LRB) with comments: CESAREAN SECTION (N/A) - Primary Cesarean Section  Patient Location: PACU and Mother/Baby  Anesthesia Type:Spinal  Level of Consciousness: awake, alert  and oriented  Airway and Oxygen Therapy: Patient Spontanous Breathing    Post-op Assessment: Patient's Cardiovascular Status Stable and Respiratory Function Stable  Post-op Vital Signs: stable  Complications: No apparent anesthesia complications

## 2012-02-23 NOTE — Brief Op Note (Signed)
02/22/2012 - 02/23/2012  2:38 AM  PATIENT:  [name]  31 y.o. female  PRE-OPERATIVE DIAGNOSIS:  Non Reassuring fetal Heart Rate Tracing  POST-OPERATIVE DIAGNOSIS:  Same  PROCEDURE:  Procedure(s) (LRB) with comments: CESAREAN SECTION (N/A)  SURGEON:  Surgeon(s) and Role:    * Leslie Andrea, MD - Primary  PHYSICIAN ASSISTANT:   ASSISTANTS: none   ANESTHESIA:   spinal  EBL:  Total I/O In: 1000 [I.V.:1000] Out: -   BLOOD ADMINISTERED:none  DRAINS: Urinary Catheter (Foley)   LOCAL MEDICATIONS USED:  NONE  SPECIMEN:  Source of Specimen:  placenta  DISPOSITION OF SPECIMEN:  PATHOLOGY  COUNTS:  YES  TOURNIQUET:  * No tourniquets in log *  DICTATION: .Other Dictation: Dictation Number [id number]  PLAN OF CARE: Admit to inpatient   PATIENT DISPOSITION:  PACU - hemodynamically stable.   Delay start of Pharmacological VTE agent (>24hrs) due to surgical blood loss or risk of bleeding: not applicable

## 2012-02-23 NOTE — Anesthesia Procedure Notes (Signed)

## 2012-02-23 NOTE — Progress Notes (Signed)
MD at the bedside to evaluate FHR tracing, decelerations and SVE.

## 2012-02-23 NOTE — Progress Notes (Signed)
Subjective: Postpartum Day 1: Cesarean Delivery Patient reports tolerating PO.    Objective: Vital signs in last 24 hours: Temp:  [97 F (36.1 C)-98.2 F (36.8 C)] 97.5 F (36.4 C) (12/21 0748) Pulse Rate:  [51-88] 61  (12/21 0748) Resp:  [12-32] 16  (12/21 0748) BP: (114-144)/(18-85) 131/74 mmHg (12/21 0748) SpO2:  [95 %-100 %] 98 % (12/21 0748) Weight:  [209 lb (94.802 kg)] 209 lb (94.802 kg) (12/20 1808)  Physical Exam:  General: alert, cooperative and no distress Lochia: appropriate Uterine Fundus: firm Incision: healing well DVT Evaluation: No evidence of DVT seen on physical exam.   Basename 02/23/12 0105 02/22/12 1815  HGB 11.7* 12.2  HCT 34.6* 36.7    Assessment/Plan: Status post Cesarean section. Doing well postoperatively.  Continue current care.  Mataio Mele II,Montrelle Eddings E 02/23/2012, 8:23 AM

## 2012-02-23 NOTE — H&P (Signed)
[name] is a 31 y.o. female presenting for induction of labor secondary to PIH and decels (see MAU note). History OB History    Grav Para Term Preterm Abortions TAB SAB Ect Mult Living   1 1 1       1      Past Medical History  Diagnosis Date  . Anxiety state     unspec  . HLD (hyperlipidemia)   . Rheumatic fever     saw cardiologist at [facility] as a child for possible rheumatic fever  . Pregnancy induced hypertension   . Infection     uti  . Fracture     ankle   Past Surgical History  Procedure Date  . Wisdom teeth removal   . No past surgeries    Family History: family history includes Diabetes in her father; Hyperlipidemia in her father; and Hypertension in her father.  There is no history of Other. Social History:  reports that she has never smoked. She has never used smokeless tobacco. She reports that she drinks alcohol. She reports that she does not use illicit drugs.   Prenatal Transfer Tool  Maternal Diabetes: No Genetic Screening: Normal Maternal Ultrasounds/Referrals: Normal Fetal Ultrasounds or other Referrals:  None Maternal Substance Abuse:  No Significant Maternal Medications:  None Significant Maternal Lab Results:  None Other Comments:  None  Review of Systems  Eyes: Negative for blurred vision.  Gastrointestinal: Negative for abdominal pain.  Neurological: Negative for headaches.    Dilation: 2 Effacement (%): 90 Station: -2 Exam by:: Dr Henderson Cloud Blood pressure 128/72, pulse 56, temperature 97.8 F (36.6 C), temperature source Axillary, resp. rate 18, height 5\' 6"  (1.676 m), weight 209 lb (94.802 kg), SpO2 97.00%, unknown if currently breastfeeding.   Fetal Exam Fetal Monitor Review: Pattern: accelerations present.       Physical Exam  Cardiovascular: Normal rate and regular rhythm.   Respiratory: Effort normal and breath sounds normal.  GI: There is no tenderness.  Neurological: She has normal reflexes.    Prenatal labs: ABO, Rh:  --/--/O NEG, O NEG (12/20 1815) Antibody: NEG (12/20 1815) Rubella: Immune (05/16 0000) RPR: Nonreactive (05/16 0000)  HBsAg: Negative (05/16 0000)  HIV: Non-reactive (05/16 0000)  GBS: Negative (11/21 0000)   Assessment/Plan: 31 yo G1P0 at 39 6/7 weeks with PIH and non repetitive FHT decels D/W patient and husband two stage induction and risiks   [name] II,[name] E 02/23/2012, 9:53 AM

## 2012-02-24 LAB — CBC
HCT: 32.9 % — ABNORMAL LOW (ref 36.0–46.0)
MCH: 30.4 pg (ref 26.0–34.0)
MCHC: 33.1 g/dL (ref 30.0–36.0)
MCV: 91.9 fL (ref 78.0–100.0)
Platelets: 185 10*3/uL (ref 150–400)
RDW: 13.3 % (ref 11.5–15.5)
WBC: 11.2 10*3/uL — ABNORMAL HIGH (ref 4.0–10.5)

## 2012-02-24 NOTE — Anesthesia Preprocedure Evaluation (Signed)

## 2012-02-24 NOTE — Anesthesia Preprocedure Evaluation (Signed)
Anesthesia Evaluation Anesthesia Physical Anesthesia Plan Anesthesia Quick Evaluation  

## 2012-02-24 NOTE — Clinical Social Work Note (Signed)
CSW spoke with MOB about hx of anxiety.  MOB reports medication management over 10 years ago, mild symptoms but nothing of concern currently.  RN noted MOB seeming anxious about education.  CSW discussed MOB's desire to be a mother and both MOB and FOB expressed excitement about being new parents.  CSW instructed MOB to let RN or MD know if any concerns arise with anxiety, and did not feel like she needed feelings after birth brochure.  CSW also discussed family support at home.  MOB and FOB had family in room and reported no concerns at this time.  Please reconsult CSW if further needs arise.    [phone]

## 2012-02-24 NOTE — Progress Notes (Signed)
Subjective: Postpartum Day 1: Cesarean Delivery Patient reports tolerating PO, + flatus and no problems voiding.    Objective: Vital signs in last 24 hours: Temp:  [97.6 F (36.4 C)-98.9 F (37.2 C)] 98.4 F (36.9 C) (12/22 0700) Pulse Rate:  [56-70] 60  (12/22 0700) Resp:  [18-20] 18  (12/22 0700) BP: (105-139)/(62-79) 113/66 mmHg (12/22 0700) SpO2:  [97 %-98 %] 97 % (12/22 0300)  Physical Exam:  General: alert, cooperative and no distress Lochia: appropriate Uterine Fundus: firm Incision: healing well DVT Evaluation: No evidence of DVT seen on physical exam.   Basename 02/24/12 0600 02/23/12 0105  HGB 10.9* 11.7*  HCT 32.9* 34.6*    Assessment/Plan: Status post Cesarean section. Doing well postoperatively.  Continue current care.  Chike Farrington II,Reiana Poteet E 02/24/2012, 7:56 AM

## 2012-02-25 ENCOUNTER — Encounter (HOSPITAL_COMMUNITY): Payer: Self-pay | Admitting: Obstetrics and Gynecology

## 2012-02-25 MED ORDER — IBUPROFEN 600 MG PO TABS: 600.0000 mg | ORAL_TABLET | Freq: Four times a day (QID) | ORAL | Status: DC | PRN

## 2012-02-25 MED ORDER — OXYCODONE-ACETAMINOPHEN 5-325 MG PO TABS: 1.0000 | ORAL_TABLET | ORAL | Status: DC | PRN

## 2012-02-25 NOTE — Discharge Summary (Signed)
Obstetric Discharge Summary Reason for Admission: observation/evaluation Prenatal Procedures: ultrasound Intrapartum Procedures: cesarean: low cervical, transverse Postpartum Procedures: none Complications-Operative and Postpartum: none Hemoglobin  Date Value Range Status  02/24/2012 10.9* 12.0 - 15.0 g/dL Final  1/61/0960 45.4   Final     HCT  Date Value Range Status  02/24/2012 32.9* 36.0 - 46.0 % Final  07/19/2011 36   Final    Physical Exam:  General: alert and cooperative Lochia: appropriate Uterine Fundus: firm Incision: honeycomb dressing noted DVT Evaluation: No evidence of DVT seen on physical exam. Negative Homan's sign. No cords or calf tenderness. No significant calf/ankle edema.  Discharge Diagnoses: Term Pregnancy-delivered  Discharge Information: Date: 02/25/2012 Activity: pelvic rest Diet: routine Medications: PNV, Ibuprofen and Percocet Condition: stable Instructions: refer to practice specific booklet Discharge to: home   Newborn Data: Live born female  Birth Weight: 7 lb 1.9 oz (3230 g) APGAR: 9,   Home with mother.  Moksha Dorgan G 02/25/2012, 8:15 AM

## 2012-02-25 NOTE — Progress Notes (Signed)
Subjective: Postpartum Day 2: Cesarean Delivery Patient reports tolerating PO, + flatus and no problems voiding.  Denies HA,or blurred vision  Objective: Vital signs in last 24 hours: Temp:  [98 F (36.7 C)-98.2 F (36.8 C)] 98 F (36.7 C) (12/23 0610) Pulse Rate:  [66-81] 73  (12/23 0610) Resp:  [18] 18  (12/23 0610) BP: (120-137)/(68-85) 120/85 mmHg (12/23 0610)  Physical Exam:  General: alert and cooperative Lochia: appropriate Uterine Fundus: firm Incision: honeycomb dressing noted DVT Evaluation: No evidence of DVT seen on physical exam. Negative Homan's sign. No cords or calf tenderness. No significant calf/ankle edema.   Basename 02/24/12 0600 02/23/12 0105  HGB 10.9* 11.7*  HCT 32.9* 34.6*    Assessment/Plan: Status post Cesarean section. Doing well postoperatively.  Discharge home with standard precautions and return to clinic in 3 days.  Rayna Brenner G 02/25/2012, 8:04 AM

## 2012-02-28 ENCOUNTER — Encounter (HOSPITAL_COMMUNITY): Payer: Self-pay | Admitting: *Deleted

## 2012-04-19 ENCOUNTER — Other Ambulatory Visit: Payer: Self-pay

## 2012-04-22 ENCOUNTER — Ambulatory Visit (HOSPITAL_COMMUNITY)
Admission: RE | Admit: 2012-04-22 | Discharge: 2012-04-22 | Disposition: A | Discharge status: Home / Self Care | Payer: BC Managed Care – PPO | Source: Ambulatory Visit | Attending: Obstetrics and Gynecology | Admitting: Obstetrics and Gynecology

## 2012-04-22 NOTE — Lactation Note (Signed)
Adult Lactation Consultation Outpatient Visit Note  Patient Name: [name] Date of Birth: 18-Oct-1980 Gestational Age at Delivery: Unknown Type of Delivery:   Breastfeeding History: Frequency of Breastfeeding: q 3 hpurs Length of Feeding:30 minutes  Voids: QS Stools:QS   Supplementing / Method: Pumping:  Type of Pump:Medela- going back to work tomorrow   Frequency: 3-4 times/day  Volume:  3-6 oz  Comments:     Consultation Evaluation: Mom has had history of yeast. Mom treated with Monostat cream and Diflucin. Baby treated with Nystatin. Mom and baby much improved but baby still has occasional yeasty diaper rash.    Initial Feeding Assessment: Pre-feed Weight: 9- 14.7 4500g Post-feed Weight: 9-15.8  4532g Amount Transferred: 32 cc's Comments: Both of mom's nipples are still pink and mom reports they are burning and stinging after pumping or nursing.. Reports that baby is biting occasionally. Baby nursed for 15 minutes with good deep latch observed. and then off the breast. Had fed 2 hours ago  Additional Feeding Assessment: Pre-feed Weight:9- 15.8  4532g Post-feed Weight: 10-2.5  4606g Amount Transferred: 74 cc's Comments:Baby nursed on left breast for 15 minutes, then off to sleep. Mom pumped with her Medela pump. Encouraged to turn suction down a little bit. Used 21 flange and mom reports that feels a little better. SN shells given to keep her clothes off her nipples. Encouraged to rub breast milk into nipples after nursing or pumping. If no improvement with these measures to call OB back and may need additional med for yeast  Total Breast milk Transferred this Visit: 106 cc's Total Supplement Given: 0  Additional Interventions: No further questions at present. Mom reports that she has come to [facility] and it was very helpful..Discussed the night group and she may want to attend that group since she is going back to work.To call prn.  Follow-Up  PRN    Pamelia Hoit 04/22/2012, 4:57 PM

## 2012-12-10 ENCOUNTER — Ambulatory Visit (INDEPENDENT_AMBULATORY_CARE_PROVIDER_SITE_OTHER): Payer: 59 | Admitting: Family Medicine

## 2012-12-10 VITALS — BP 112/78 | HR 61 | Temp 98.4°F | Resp 20 | Ht 66.25 in | Wt 162.2 lb

## 2012-12-10 DIAGNOSIS — Z0000 Encounter for general adult medical examination without abnormal findings: Secondary | ICD-10-CM

## 2012-12-10 LAB — CBC
MCH: 31.3 pg (ref 26.0–34.0)
MCV: 92.9 fL (ref 78.0–100.0)
Platelets: 268 10*3/uL (ref 150–400)
RBC: 4.22 MIL/uL (ref 3.87–5.11)
RDW: 13.6 % (ref 11.5–15.5)
WBC: 5.3 10*3/uL (ref 4.0–10.5)

## 2012-12-10 LAB — LIPID PANEL
HDL: 53 mg/dL (ref >39)
LDL Cholesterol: 115 mg/dL — ABNORMAL HIGH (ref 0–99)
Total CHOL/HDL Ratio: 3.6 Ratio

## 2012-12-10 LAB — POCT URINALYSIS DIPSTICK
Leukocytes, UA: NEGATIVE
Nitrite, UA: NEGATIVE
Protein, UA: NEGATIVE
Urobilinogen, UA: 0.2
pH, UA: 6.5

## 2012-12-10 LAB — COMPREHENSIVE METABOLIC PANEL
ALT: 17 U/L (ref 0–35)
AST: 17 U/L (ref 0–37)
CO2: 30 mEq/L (ref 19–32)
Chloride: 103 mEq/L (ref 96–112)
Creat: 0.67 mg/dL (ref 0.50–1.10)
Sodium: 137 mEq/L (ref 135–145)
Total Bilirubin: 0.6 mg/dL (ref 0.3–1.2)
Total Protein: 7 g/dL (ref 6.0–8.3)

## 2012-12-10 LAB — POCT UA - MICROSCOPIC ONLY
Casts, Ur, LPF, POC: NEGATIVE
Crystals, Ur, HPF, POC: NEGATIVE
Yeast, UA: NEGATIVE

## 2012-12-10 LAB — HEMOGLOBIN A1C
Hgb A1c MFr Bld: 5.4 % (ref <5.7)
Mean Plasma Glucose: 108 mg/dL (ref <117)

## 2012-12-10 NOTE — Progress Notes (Signed)
Subjective:    Patient ID: [name], female    DOB: 08-30-1980, 32 y.o.   MRN: [id number]  This chart was scribed for Ethelda Chick, MD by Blanchard Kelch, ED Scribe. The patient was seen in room 13. Patient's care was started at 8:42 AM.   HPI  [name] is a 32 y.o. 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 Her last physical exam was in 2012. Her last pap smear was in February by [facility]. It was done right after she gave birth to her child. She had her last eye exam in August and wears contacts. She goes to the dentist every six months. She has never had a colonoscopy or mammogram. She has not yet had her flu shot but will get it at work. She was breastfeeding her child. She was on an IUD but got it taken out and is not currently on birth control. She does not believe she is currently pregnant. She has past surgical histories of wisdom teeth removal and cesarean section. She has a medical history of high cholesterol. She also had PVCs but they have subsided. Her dad has diabetes, hypertension, high cholesterol. Her mother has no pertinent medical history. There is no family history of heart attack, cancer, or stroke. She has been married 6 years and had a child 9 months ago. She does not smoke or use drugs. She runs about 20 miles a week. 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  She is an only child. She denies ringing in ears, dizziness, blurred vision, double vision, mouth sores, chest pain, current palpations, shortness of breath, neck pain, shoulder pain, numbness or tingling in extremities, leg swelling, dysuria, hematuria, frequency, urinary incontinence, constipation, diarrhea, heartburn, insomnia, depression, anxiety. Her pre-pregnancy weight was 152 and she is currently 162.  Past Medical History  Diagnosis Date  . Anxiety state     unspec  . Rheumatic fever     saw cardiologist at  [facility] as a child for possible rheumatic fever  . Pregnancy induced hypertension   . Infection     uti  . Fracture     ankle  . HLD (hyperlipidemia)    Past Surgical History  Procedure Laterality Date  . Wisdom teeth removal    . No past surgeries    . Cesarean section  02/23/2012    Procedure: CESAREAN SECTION;  Surgeon: Leslie Andrea, MD;  Location: [facility];  Service: Obstetrics;  Laterality: N/A;  Primary Cesarean Section   Family History  Problem Relation Age of Onset  . Hypertension Father   . Hyperlipidemia Father   . Diabetes Father   . Other Neg Hx    No Known Allergies   Review of Systems  All other systems reviewed and are negative.       Objective:   Physical Exam  Constitutional: She is oriented to person, place, and time. She appears well-developed and well-nourished. No distress.  HENT:  Head: Normocephalic and atraumatic.  Right Ear: External ear normal.  Left Ear: External ear normal.  Nose: Nose normal.  Mouth/Throat: Oropharynx is clear and moist.  Eyes: Conjunctivae and EOM are normal. Pupils are equal, round, and reactive to light.  Neck: Normal range of motion. Neck supple.  Thyroid slightly prominent without nodules.   Cardiovascular: Normal rate and regular rhythm.   No murmur heard. Pulmonary/Chest: Effort normal and breath  sounds normal. No respiratory distress.  Abdominal: Soft. Bowel sounds are normal. She exhibits no distension. There is no tenderness. There is no rebound and no guarding.  Musculoskeletal: Normal range of motion. She exhibits no edema.       Right shoulder: Normal.       Left shoulder: Normal.       Right elbow: Normal.      Left elbow: Normal.       Right wrist: Normal.       Left wrist: Normal.       Right hip: Normal.       Left hip: Normal.       Right knee: Normal.       Left knee: Normal.       Right ankle: Normal.       Left ankle: Normal.       Cervical back: Normal.       Thoracic back: Normal.        Lumbar back: Normal.       Right upper arm: Normal.       Left upper arm: Normal.       Right forearm: Normal.       Left forearm: Normal.       Right hand: Normal.       Left hand: Normal.       Right upper leg: Normal.       Left upper leg: Normal.       Right lower leg: Normal.       Left lower leg: Normal.       Right foot: Normal.       Left foot: Normal.  Lymphadenopathy:    She has no cervical adenopathy.  Neurological: She is alert and oriented to person, place, and time.  Skin: Skin is warm and dry.  Psychiatric: She has a normal mood and affect.          Assessment & Plan:   The encounter diagnosis was Routine general medical examination at a health care facility.  1. CPE; Anticipatory guidance provided. Pap smear UTD per gynecology.  Immunizations UTD.  Obtain labs.  Does not desire contraception; continue PNV.  Insurance form completed.

## 2012-12-11 ENCOUNTER — Encounter: Payer: Self-pay | Admitting: Family Medicine

## 2013-01-08 ENCOUNTER — Other Ambulatory Visit: Payer: Self-pay

## 2013-08-13 ENCOUNTER — Encounter (HOSPITAL_COMMUNITY): Payer: Self-pay | Admitting: Pharmacist

## 2013-08-19 ENCOUNTER — Encounter (HOSPITAL_COMMUNITY): Payer: Self-pay

## 2013-08-19 ENCOUNTER — Encounter (HOSPITAL_COMMUNITY)
Admission: RE | Admit: 2013-08-19 | Discharge: 2013-08-19 | Disposition: A | Discharge status: Home / Self Care | Payer: 59 | Source: Ambulatory Visit | Attending: Obstetrics and Gynecology | Admitting: Obstetrics and Gynecology

## 2013-08-19 LAB — CBC
HCT: 36.4 % (ref 36.0–46.0)
HEMOGLOBIN: 12.4 g/dL (ref 12.0–15.0)
MCH: 30.8 pg (ref 26.0–34.0)
MCHC: 34.1 g/dL (ref 30.0–36.0)
MCV: 90.3 fL (ref 78.0–100.0)
Platelets: 190 10*3/uL (ref 150–400)
RBC: 4.03 MIL/uL (ref 3.87–5.11)
RDW: 12.8 % (ref 11.5–15.5)
WBC: 8.9 10*3/uL (ref 4.0–10.5)

## 2013-08-19 LAB — RPR

## 2013-08-19 LAB — TYPE AND SCREEN
ABO/RH(D): O NEG
ANTIBODY SCREEN: NEGATIVE

## 2013-08-19 NOTE — Patient Instructions (Signed)
[place]  08/19/2013   Your procedure is scheduled on:  08/21/13  Enter through the Main Entrance of [facility] up the phone at the desk and dial [phone].   Call this number if you have problems the morning of surgery: [phone]   Remember:   Do not eat food:After Midnight.  Do not drink clear liquids: After Midnight.  Take these medicines the morning of surgery with A SIP OF WATER: NA   Do not wear jewelry, make-up or nail polish.  Do not wear lotions, powders, or perfumes. You may wear deodorant.  Do not shave 48 hours prior to surgery.  Do not bring valuables to the hospital.  [facility] is not   responsible for any belongings or valuables brought to the hospital.  Contacts, dentures or bridgework may not be worn into surgery.  Leave suitcase in the car. After surgery it may be brought to your room.  For patients admitted to the hospital, checkout time is 11:00 AM the day of              discharge.   Patients discharged the day of surgery will not be allowed to drive             home.  Name and phone number of your driver: NA  Special Instructions:      Please read over the following fact sheets that you were given:   Surgical Site Infection Prevention

## 2013-08-20 NOTE — H&P (Signed)
[name] is a 33 y.o. female presenting for repeat cesarean section. Maternal Medical History:  Fetal activity: Perceived fetal activity is normal.      OB History   Grav Para Term Preterm Abortions TAB SAB Ect Mult Living   2 1 1       1      Past Medical History  Diagnosis Date  . Anxiety state     unspec  . Rheumatic fever     saw cardiologist at [facility] as a child for possible rheumatic fever  . Pregnancy induced hypertension   . Infection     uti  . Fracture     ankle  . HLD (hyperlipidemia)    Past Surgical History  Procedure Laterality Date  . Wisdom teeth removal    . Cesarean section  02/23/2012    Procedure: CESAREAN SECTION;  Surgeon: Allena Katz, MD;  Location: [facility];  Service: Obstetrics;  Laterality: N/A;  Primary Cesarean Section   Family History: family history includes Diabetes in her father; Hyperlipidemia in her father; Hypertension in her father. There is no history of Other. Social History:  reports that she has never smoked. She has never used smokeless tobacco. She reports that she drinks alcohol. She reports that she does not use illicit drugs.   Prenatal Transfer Tool  Maternal Diabetes: No Genetic Screening: Normal Maternal Ultrasounds/Referrals: Normal Fetal Ultrasounds or other Referrals:  None Maternal Substance Abuse:  No Significant Maternal Medications:  None Significant Maternal Lab Results:  None Other Comments:  None  Review of Systems  Eyes: Negative for blurred vision.  Gastrointestinal: Negative for abdominal pain.  Neurological: Negative for headaches.      Last menstrual period 11/16/2012. Exam Physical Exam  Cardiovascular: Normal rate and regular rhythm.   Respiratory: Effort normal and breath sounds normal.  GI: Soft. There is no tenderness.  Neurological: She has normal reflexes.    Prenatal labs: ABO, Rh: --/--/O NEG (06/17 0855) Antibody: NEG (06/17 0855) Rubella:   RPR: NON REAC (06/17 0855)   HBsAg:    HIV:    GBS:     Assessment/Plan: 33 yo G2P1 at 23 5/7 weeks for repeat cesarean section. Risks reviewed including infection, organ damage, bleeding/transfusion-HIV/Hep, DVT/PE, pneumonia. All questions answered.   TOMBLIN II,JAMES E 08/20/2013, 6:01 PM

## 2013-08-21 ENCOUNTER — Encounter (HOSPITAL_COMMUNITY)
Admission: AD | Disposition: A | Discharge status: Home / Self Care | Payer: Self-pay | Source: Ambulatory Visit | Attending: Obstetrics and Gynecology

## 2013-08-21 ENCOUNTER — Encounter (HOSPITAL_COMMUNITY): Payer: Self-pay | Admitting: Anesthesiology

## 2013-08-21 ENCOUNTER — Encounter (HOSPITAL_COMMUNITY): Payer: 59 | Admitting: Anesthesiology

## 2013-08-21 ENCOUNTER — Inpatient Hospital Stay (HOSPITAL_COMMUNITY): Payer: 59 | Admitting: Anesthesiology

## 2013-08-21 ENCOUNTER — Inpatient Hospital Stay (HOSPITAL_COMMUNITY)
Admission: AD | Admit: 2013-08-21 | Discharge: 2013-08-23 | DRG: 766 | Disposition: A | Discharge status: Home / Self Care | Payer: 59 | Source: Ambulatory Visit | Attending: Obstetrics and Gynecology | Admitting: Obstetrics and Gynecology

## 2013-08-21 DIAGNOSIS — Z833 Family history of diabetes mellitus: Secondary | ICD-10-CM

## 2013-08-21 DIAGNOSIS — F411 Generalized anxiety disorder: Secondary | ICD-10-CM | POA: Diagnosis present

## 2013-08-21 DIAGNOSIS — O99344 Other mental disorders complicating childbirth: Secondary | ICD-10-CM | POA: Diagnosis present

## 2013-08-21 DIAGNOSIS — Z8249 Family history of ischemic heart disease and other diseases of the circulatory system: Secondary | ICD-10-CM

## 2013-08-21 DIAGNOSIS — Z98891 History of uterine scar from previous surgery: Secondary | ICD-10-CM

## 2013-08-21 DIAGNOSIS — O34219 Maternal care for unspecified type scar from previous cesarean delivery: Principal | ICD-10-CM | POA: Diagnosis present

## 2013-08-21 LAB — TYPE AND SCREEN
ABO/RH(D): O NEG
ANTIBODY SCREEN: NEGATIVE

## 2013-08-21 SURGERY — Surgical Case
Anesthesia: Spinal | Site: Abdomen

## 2013-08-21 MED ORDER — ONDANSETRON HCL 4 MG/2ML IJ SOLN: 4.0000 mg | Freq: Three times a day (TID) | INTRAMUSCULAR | Status: DC | PRN

## 2013-08-21 MED ORDER — SENNOSIDES-DOCUSATE SODIUM 8.6-50 MG PO TABS
2.0000 | ORAL_TABLET | ORAL | Status: DC
  Administered 2013-08-22 (×2): 2 via ORAL
  Filled 2013-08-21 (×2): qty 2

## 2013-08-21 MED ORDER — LANOLIN HYDROUS EX OINT: 1.0000 "application " | TOPICAL_OINTMENT | CUTANEOUS | Status: DC | PRN

## 2013-08-21 MED ORDER — SIMETHICONE 80 MG PO CHEW
80.0000 mg | CHEWABLE_TABLET | ORAL | Status: DC | PRN
  Administered 2013-08-22: 80 mg via ORAL

## 2013-08-21 MED ORDER — SODIUM CHLORIDE 0.9 % IJ SOLN: 3.0000 mL | INTRAMUSCULAR | Status: DC | PRN

## 2013-08-21 MED ORDER — ONDANSETRON HCL 4 MG/2ML IJ SOLN
4.0000 mg | INTRAMUSCULAR | Status: DC | PRN
Start: 2013-08-21 — End: 2013-08-23

## 2013-08-21 MED ORDER — FENTANYL CITRATE 0.05 MG/ML IJ SOLN
INTRAMUSCULAR | Status: AC
  Filled 2013-08-21: qty 2

## 2013-08-21 MED ORDER — PHENYLEPHRINE HCL 10 MG/ML IJ SOLN
INTRAMUSCULAR | Status: AC
  Filled 2013-08-21: qty 1

## 2013-08-21 MED ORDER — IBUPROFEN 600 MG PO TABS
600.0000 mg | ORAL_TABLET | Freq: Four times a day (QID) | ORAL | Status: DC
  Administered 2013-08-22 – 2013-08-23 (×7): 600 mg via ORAL
  Filled 2013-08-21 (×7): qty 1

## 2013-08-21 MED ORDER — SIMETHICONE 80 MG PO CHEW
80.0000 mg | CHEWABLE_TABLET | ORAL | Status: DC
  Administered 2013-08-22 (×2): 80 mg via ORAL
  Filled 2013-08-21 (×2): qty 1

## 2013-08-21 MED ORDER — DIPHENHYDRAMINE HCL 50 MG/ML IJ SOLN: 25.0000 mg | INTRAMUSCULAR | Status: DC | PRN

## 2013-08-21 MED ORDER — KETOROLAC TROMETHAMINE 30 MG/ML IJ SOLN
30.0000 mg | Freq: Four times a day (QID) | INTRAMUSCULAR | Status: AC | PRN
  Administered 2013-08-21: 30 mg via INTRAVENOUS
  Filled 2013-08-21: qty 1

## 2013-08-21 MED ORDER — ZOLPIDEM TARTRATE 5 MG PO TABS: 5.0000 mg | ORAL_TABLET | Freq: Every evening | ORAL | Status: DC | PRN

## 2013-08-21 MED ORDER — MORPHINE SULFATE 0.5 MG/ML IJ SOLN
INTRAMUSCULAR | Status: AC
  Filled 2013-08-21: qty 10

## 2013-08-21 MED ORDER — DIPHENHYDRAMINE HCL 25 MG PO CAPS
25.0000 mg | ORAL_CAPSULE | ORAL | Status: DC | PRN
  Filled 2013-08-21: qty 1

## 2013-08-21 MED ORDER — DIPHENHYDRAMINE HCL 25 MG PO CAPS: 25.0000 mg | ORAL_CAPSULE | Freq: Four times a day (QID) | ORAL | Status: DC | PRN

## 2013-08-21 MED ORDER — PRENATAL MULTIVITAMIN CH
1.0000 | ORAL_TABLET | Freq: Every day | ORAL | Status: DC
  Administered 2013-08-21 – 2013-08-22 (×2): 1 via ORAL
  Filled 2013-08-21 (×3): qty 1

## 2013-08-21 MED ORDER — ONDANSETRON HCL 4 MG PO TABS
4.0000 mg | ORAL_TABLET | ORAL | Status: DC | PRN
Start: 2013-08-21 — End: 2013-08-23

## 2013-08-21 MED ORDER — OXYCODONE-ACETAMINOPHEN 5-325 MG PO TABS
1.0000 | ORAL_TABLET | ORAL | Status: DC | PRN
  Administered 2013-08-22 (×4): 1 via ORAL
  Administered 2013-08-22: 2 via ORAL
  Administered 2013-08-22: 1 via ORAL
  Administered 2013-08-23 (×3): 2 via ORAL
  Filled 2013-08-21 (×2): qty 1
  Filled 2013-08-21: qty 2
  Filled 2013-08-21 (×2): qty 1
  Filled 2013-08-21 (×2): qty 2
  Filled 2013-08-21: qty 1
  Filled 2013-08-21: qty 2

## 2013-08-21 MED ORDER — SCOPOLAMINE 1 MG/3DAYS TD PT72
1.0000 | MEDICATED_PATCH | Freq: Once | TRANSDERMAL | Status: DC
Start: 2013-08-21 — End: 2013-08-21
  Administered 2013-08-21: 1.5 mg via TRANSDERMAL

## 2013-08-21 MED ORDER — CEFAZOLIN SODIUM-DEXTROSE 2-3 GM-% IV SOLR
2.0000 g | INTRAVENOUS | Status: AC
  Administered 2013-08-21: 2 g via INTRAVENOUS

## 2013-08-21 MED ORDER — OXYTOCIN 10 UNIT/ML IJ SOLN
40.0000 [IU] | INTRAVENOUS | Status: DC | PRN
  Administered 2013-08-21: 40 [IU] via INTRAVENOUS

## 2013-08-21 MED ORDER — MEPERIDINE HCL 25 MG/ML IJ SOLN: 6.2500 mg | INTRAMUSCULAR | Status: DC | PRN

## 2013-08-21 MED ORDER — DIBUCAINE 1 % RE OINT: 1.0000 "application " | TOPICAL_OINTMENT | RECTAL | Status: DC | PRN

## 2013-08-21 MED ORDER — CEFAZOLIN SODIUM-DEXTROSE 2-3 GM-% IV SOLR
INTRAVENOUS | Status: AC
  Filled 2013-08-21: qty 50

## 2013-08-21 MED ORDER — SODIUM CHLORIDE 0.9 % IJ SOLN
INTRAMUSCULAR | Status: AC
  Filled 2013-08-21: qty 20

## 2013-08-21 MED ORDER — 0.9 % SODIUM CHLORIDE (POUR BTL) OPTIME
TOPICAL | Status: DC | PRN
  Administered 2013-08-21: 1000 mL

## 2013-08-21 MED ORDER — TETANUS-DIPHTH-ACELL PERTUSSIS 5-2.5-18.5 LF-MCG/0.5 IM SUSP: 0.5000 mL | Freq: Once | INTRAMUSCULAR | Status: DC

## 2013-08-21 MED ORDER — NALBUPHINE HCL 10 MG/ML IJ SOLN: 5.0000 mg | INTRAMUSCULAR | Status: DC | PRN

## 2013-08-21 MED ORDER — SIMETHICONE 80 MG PO CHEW
80.0000 mg | CHEWABLE_TABLET | Freq: Three times a day (TID) | ORAL | Status: DC
  Administered 2013-08-21 – 2013-08-23 (×4): 80 mg via ORAL
  Filled 2013-08-21 (×5): qty 1

## 2013-08-21 MED ORDER — PHENYLEPHRINE 8 MG IN D5W 100 ML (0.08MG/ML) PREMIX OPTIME
INJECTION | INTRAVENOUS | Status: DC | PRN
  Administered 2013-08-21: 60 ug/min via INTRAVENOUS

## 2013-08-21 MED ORDER — WITCH HAZEL-GLYCERIN EX PADS: 1.0000 "application " | MEDICATED_PAD | CUTANEOUS | Status: DC | PRN

## 2013-08-21 MED ORDER — SCOPOLAMINE 1 MG/3DAYS TD PT72
1.0000 | MEDICATED_PATCH | Freq: Once | TRANSDERMAL | Status: DC
  Filled 2013-08-21: qty 1

## 2013-08-21 MED ORDER — FENTANYL CITRATE 0.05 MG/ML IJ SOLN
INTRAMUSCULAR | Status: DC | PRN
  Administered 2013-08-21: 15 ug via INTRATHECAL

## 2013-08-21 MED ORDER — BUPIVACAINE LIPOSOME 1.3 % IJ SUSP
20.0000 mL | Freq: Once | INTRAMUSCULAR | Status: AC
  Administered 2013-08-21: 20 mL
  Filled 2013-08-21: qty 20

## 2013-08-21 MED ORDER — BUPIVACAINE IN DEXTROSE 0.75-8.25 % IT SOLN
INTRATHECAL | Status: DC | PRN
  Administered 2013-08-21: 1.6 mL via INTRATHECAL

## 2013-08-21 MED ORDER — OXYTOCIN 40 UNITS IN LACTATED RINGERS INFUSION - SIMPLE MED: 62.5000 mL/h | INTRAVENOUS | Status: AC

## 2013-08-21 MED ORDER — LACTATED RINGERS IV SOLN
INTRAVENOUS | Status: DC
  Administered 2013-08-21 (×4): via INTRAVENOUS

## 2013-08-21 MED ORDER — ONDANSETRON HCL 4 MG/2ML IJ SOLN
INTRAMUSCULAR | Status: DC | PRN
  Administered 2013-08-21: 4 mg via INTRAVENOUS

## 2013-08-21 MED ORDER — LACTATED RINGERS IV SOLN
INTRAVENOUS | Status: DC
  Administered 2013-08-22: via INTRAVENOUS

## 2013-08-21 MED ORDER — PHENYLEPHRINE 40 MCG/ML (10ML) SYRINGE FOR IV PUSH (FOR BLOOD PRESSURE SUPPORT)
PREFILLED_SYRINGE | INTRAVENOUS | Status: AC
Start: 2013-08-21 — End: 2013-08-21
  Filled 2013-08-21: qty 5

## 2013-08-21 MED ORDER — DIPHENHYDRAMINE HCL 50 MG/ML IJ SOLN
INTRAMUSCULAR | Status: AC
  Administered 2013-08-21: 12.5 mg via INTRAVENOUS
  Filled 2013-08-21: qty 1

## 2013-08-21 MED ORDER — PHENYLEPHRINE HCL 10 MG/ML IJ SOLN
INTRAMUSCULAR | Status: DC | PRN
  Administered 2013-08-21: 40 ug via INTRAVENOUS

## 2013-08-21 MED ORDER — METOCLOPRAMIDE HCL 5 MG/ML IJ SOLN: 10.0000 mg | Freq: Three times a day (TID) | INTRAMUSCULAR | Status: DC | PRN

## 2013-08-21 MED ORDER — FENTANYL CITRATE 0.05 MG/ML IJ SOLN: 25.0000 ug | INTRAMUSCULAR | Status: DC | PRN

## 2013-08-21 MED ORDER — NALOXONE HCL 0.4 MG/ML IJ SOLN: 0.4000 mg | INTRAMUSCULAR | Status: DC | PRN

## 2013-08-21 MED ORDER — LACTATED RINGERS IV SOLN
INTRAVENOUS | Status: DC | PRN
  Administered 2013-08-21: 10:00:00 via INTRAVENOUS

## 2013-08-21 MED ORDER — DIPHENHYDRAMINE HCL 50 MG/ML IJ SOLN
12.5000 mg | INTRAMUSCULAR | Status: DC | PRN
  Administered 2013-08-21: 12.5 mg via INTRAVENOUS

## 2013-08-21 MED ORDER — MORPHINE SULFATE (PF) 0.5 MG/ML IJ SOLN
INTRAMUSCULAR | Status: DC | PRN
  Administered 2013-08-21: .1 mg via INTRATHECAL

## 2013-08-21 MED ORDER — ONDANSETRON HCL 4 MG/2ML IJ SOLN
INTRAMUSCULAR | Status: AC
  Filled 2013-08-21: qty 2

## 2013-08-21 MED ORDER — SCOPOLAMINE 1 MG/3DAYS TD PT72
MEDICATED_PATCH | TRANSDERMAL | Status: AC
  Filled 2013-08-21: qty 1

## 2013-08-21 MED ORDER — KETOROLAC TROMETHAMINE 30 MG/ML IJ SOLN: 30.0000 mg | Freq: Four times a day (QID) | INTRAMUSCULAR | Status: AC | PRN

## 2013-08-21 MED ORDER — NALOXONE HCL 1 MG/ML IJ SOLN
1.0000 ug/kg/h | INTRAMUSCULAR | Status: DC | PRN
  Filled 2013-08-21: qty 2

## 2013-08-21 MED ORDER — MENTHOL 3 MG MT LOZG: 1.0000 | LOZENGE | OROMUCOSAL | Status: DC | PRN

## 2013-08-21 MED ORDER — OXYTOCIN 10 UNIT/ML IJ SOLN
INTRAMUSCULAR | Status: AC
  Filled 2013-08-21: qty 4

## 2013-08-21 SURGICAL SUPPLY — 34 items
BENZOIN TINCTURE PRP APPL 2/3 (GAUZE/BANDAGES/DRESSINGS) ×3 IMPLANT
CLAMP CORD UMBIL (MISCELLANEOUS) IMPLANT
CLOSURE WOUND 1/2 X4 (GAUZE/BANDAGES/DRESSINGS) ×1
CLOTH BEACON ORANGE TIMEOUT ST (SAFETY) ×3 IMPLANT
CONTAINER PREFILL 10% NBF 15ML (MISCELLANEOUS) IMPLANT
DERMABOND ADVANCED (GAUZE/BANDAGES/DRESSINGS)
DERMABOND ADVANCED .7 DNX12 (GAUZE/BANDAGES/DRESSINGS) IMPLANT
DRAPE LG THREE QUARTER DISP (DRAPES) IMPLANT
DRSG OPSITE POSTOP 4X10 (GAUZE/BANDAGES/DRESSINGS) ×3 IMPLANT
DURAPREP 26ML APPLICATOR (WOUND CARE) ×3 IMPLANT
ELECT REM PT RETURN 9FT ADLT (ELECTROSURGICAL) ×3
ELECTRODE REM PT RTRN 9FT ADLT (ELECTROSURGICAL) ×1 IMPLANT
EXTRACTOR VACUUM M CUP 4 TUBE (SUCTIONS) IMPLANT
EXTRACTOR VACUUM M CUP 4' TUBE (SUCTIONS)
GLOVE BIO SURGEON STRL SZ8 (GLOVE) ×3 IMPLANT
GOWN STRL REUS W/TWL LRG LVL3 (GOWN DISPOSABLE) ×6 IMPLANT
KIT ABG SYR 3ML LUER SLIP (SYRINGE) ×3 IMPLANT
NEEDLE HYPO 21X1.5 SAFETY (NEEDLE) ×3 IMPLANT
NEEDLE HYPO 25X1 1.5 SAFETY (NEEDLE) ×3 IMPLANT
NEEDLE HYPO 25X5/8 SAFETYGLIDE (NEEDLE) ×3 IMPLANT
NS IRRIG 1000ML POUR BTL (IV SOLUTION) ×3 IMPLANT
PACK C SECTION WH (CUSTOM PROCEDURE TRAY) ×3 IMPLANT
PAD OB MATERNITY 4.3X12.25 (PERSONAL CARE ITEMS) ×3 IMPLANT
STAPLER VISISTAT 35W (STAPLE) IMPLANT
STRIP CLOSURE SKIN 1/2X4 (GAUZE/BANDAGES/DRESSINGS) ×2 IMPLANT
SUT MNCRL 0 VIOLET CTX 36 (SUTURE) ×4 IMPLANT
SUT MONOCRYL 0 CTX 36 (SUTURE) ×8
SUT PDS AB 0 CTX 60 (SUTURE) ×3 IMPLANT
SUT PLAIN 0 NONE (SUTURE) IMPLANT
SUT VIC AB 4-0 KS 27 (SUTURE) ×3 IMPLANT
SYR 20CC LL (SYRINGE) ×6 IMPLANT
TOWEL OR 17X24 6PK STRL BLUE (TOWEL DISPOSABLE) ×3 IMPLANT
TRAY FOLEY CATH 14FR (SET/KITS/TRAYS/PACK) ×3 IMPLANT
WATER STERILE IRR 1000ML POUR (IV SOLUTION) ×3 IMPLANT

## 2013-08-21 NOTE — Brief Op Note (Signed)
08/21/2013  10:45 AM  PATIENT:  [name]  33 y.o. female  PRE-OPERATIVE DIAGNOSIS:  PREVIOUS X 1  POST-OPERATIVE DIAGNOSIS:  PREVIOUS X 1  PROCEDURE:  Procedure(s) with comments: REPEAT CESAREAN SECTION (N/A) - REPEAT EDC 08/23/13  SURGEON:  Surgeon(s) and Role:    * Allena Katz, MD - Primary  PHYSICIAN ASSISTANT:   ASSISTANTS: none   ANESTHESIA:   spinal  EBL:  Total I/O In: 2100 [I.V.:2100] Out: -   BLOOD ADMINISTERED:none  DRAINS: Urinary Catheter (Foley)   LOCAL MEDICATIONS USED:  Amount: 20 ml in 27ml of saline and OTHER Exparel  SPECIMEN:  Source of Specimen:  placenta  DISPOSITION OF SPECIMEN:  PATHOLOGY  COUNTS:  YES  TOURNIQUET:  * No tourniquets in log *  DICTATION: .Other Dictation: Dictation Number [id number]  PLAN OF CARE: Admit to inpatient   PATIENT DISPOSITION:  PACU - hemodynamically stable.   Delay start of Pharmacological VTE agent (>24hrs) due to surgical blood loss or risk of bleeding: not applicable

## 2013-08-21 NOTE — Progress Notes (Signed)
No changes to H&P per patient history Reviewed with patient procedure-repeat cesarean section All questions answered

## 2013-08-21 NOTE — Lactation Note (Signed)
This note was copied from the chart of [place]. Lactation Consultation Note  Patient Name: [name] [id number] Date: 08/21/2013 Reason for consult: Initial assessment Baby 7 hours of life. Mom reports baby is very sleepy at breast. Enc mom to offer lots of STS, and to nurse with cues, at least 8-12 times/24 hours. Mom states she is able to hand express colostrum already. Mom states that she breastfed first child for 8 months, weaning just as she found out she was pregnant with this baby. Mom given [facility] brochure, aware of OP/BFSG and community resources. Enc mom to call out for assistance with latching baby as needed.   Maternal Data Has patient been taught Hand Expression?: Yes Does the patient have breastfeeding experience prior to this delivery?: Yes  Feeding    LATCH Score/Interventions Latch:  (Mom getting up to ambulate to bathroom first time since C/S.)              Intervention(s): Breastfeeding basics reviewed     Lactation Tools Discussed/Used     Consult Status Consult Status: Follow-up Date: 08/22/13 Follow-up type: In-patient    Inocente Salles 08/21/2013, 6:00 PM

## 2013-08-21 NOTE — Anesthesia Preprocedure Evaluation (Signed)
Anesthesia Evaluation  Patient identified by MRN, date of birth, ID band Patient awake    Reviewed: Allergy & Precautions, H&P , Patient's Chart, lab work & pertinent test results  Airway Mallampati: II TM Distance: >3 FB Neck ROM: full    Dental no notable dental hx.    Pulmonary  breath sounds clear to auscultation  Pulmonary exam normal       Cardiovascular Exercise Tolerance: Good hypertension, Rhythm:regular Rate:Normal     Neuro/Psych    GI/Hepatic   Endo/Other    Renal/GU      Musculoskeletal   Abdominal   Peds  Hematology   Anesthesia Other Findings   Reproductive/Obstetrics                           Anesthesia Physical Anesthesia Plan  ASA: II  Anesthesia Plan: Spinal   Post-op Pain Management:    Induction:   Airway Management Planned:   Additional Equipment:   Intra-op Plan:   Post-operative Plan:   Informed Consent: I have reviewed the patients History and Physical, chart, labs and discussed the procedure including the risks, benefits and alternatives for the proposed anesthesia with the patient or authorized representative who has indicated his/her understanding and acceptance.   Dental Advisory Given  Plan Discussed with: CRNA  Anesthesia Plan Comments: (Lab work confirmed with CRNA in room. Platelets okay. Discussed spinal anesthetic, and patient consents to the procedure:  included risk of possible headache,backache, failed block, allergic reaction, and nerve injury. This patient was asked if she had any questions or concerns before the procedure started. )        Anesthesia Quick Evaluation

## 2013-08-21 NOTE — Op Note (Signed)
[name], [name] NO.:  [id number]  MEDICAL RECORD NO.:  [id number]  LOCATION:  [facility]                          FACILITY:  [place]  PHYSICIAN:  Daleen Bo. Gaetano Net, M.D. DATE OF BIRTH:  [birth year]  DATE OF PROCEDURE:  08/21/2013 DATE OF DISCHARGE:                              OPERATIVE REPORT   PREOPERATIVE DIAGNOSIS:  Previous cesarean section, desires repeat.  POSTOPERATIVE DIAGNOSIS:  Previous cesarean section, desires repeat.  PROCEDURE:  Repeat low-transverse cesarean section.  SURGEON:  Daleen Bo. Gaetano Net, M.D.  ANESTHESIA:  Spinal, Midge Minium, M.D.  ESTIMATED BLOOD LOSS:  500 mL.  SPECIMENS:  Placenta to Pathology.  FINDINGS:  Viable female infant.  Apgars, weight, and arterial cord pH pending at the time of dictation.  INDICATIONS AND CONSENT:  This patient is a 33 year old married white female, G2, P1, at 65 and 5/7th weeks, with previous cesarean section. She desires repeat.  Procedure was discussed preoperatively.  Potential risks and complications were reviewed preoperatively including, but not limited to, infection, organ damage, bleeding requiring transfusion of blood products with HIV and hepatitis acquisition, DVT, PE, pneumonia. All questions were answered and consent was signed on the chart.  PROCEDURE:  The patient was taken to the operating room where she was identified, spinal anesthetic was placed and she was placed in a dorsal supine position with a 15-degree left lateral wedge.  Foley catheter was placed.  She was prepped and draped per [facility] protocol.  Time- out undertaken.  After testing for adequate spinal anesthesia, skin was entered through the previous Pfannenstiel scar and dissection was carried out in layers to the peritoneum.  Peritoneum was incised extended superiorly and inferiorly.  Vesicouterine peritoneum was taken down cephalad laterally.  The bladder flap was developed and the bladder blade was  placed.  Uterus was incised in a low transverse manner.  The uterine cavity was entered bluntly with a hemostat and clear fluid was noted.  The uterine incision was extended cephalad laterally with the fingers.  Vertex was delivered without difficulty.  A loose nuchal cord x1 was reduced.  Baby was delivered and good cry and tone was noted. Cord was clamped and cut.  The baby was handed to awaiting pediatrics team.  Placenta was manually delivered and the cavity was clean.  Uterus was closed in a running, locking fashion with 0-Monocryl suture, which achieved good hemostasis.  Anterior peritoneum was closed in a running fashion with 0-Monocryl suture, which was also used to reapproximate the pyramidalis muscle in the midline.  Anterior rectus fascia was closed in a running fashion with a 0-PDS suture.  20 mL of Exparel in 20 mL of normal saline was then injected both subfascially and subcutaneously. Subcutaneous tissue was closed with interrupted plain suture and the skin was closed with a subcuticular Vicryl on a Keith needle.  Steri-Strips and dressings were applied.  All counts were correct.  The patient was taken to the recovery room in stable condition.     Daleen Bo Gaetano Net, M.D.     JET/MEDQ  D:  08/21/2013  T:  08/21/2013  Job:  [id number]

## 2013-08-21 NOTE — Anesthesia Postprocedure Evaluation (Signed)
  Anesthesia Post-op Note  Patient: [name]  Procedure(s) Performed: Procedure(s) with comments: REPEAT CESAREAN SECTION (N/A) - REPEAT EDC 08/23/13 Patient is awake and responsive. Pain and nausea are reasonably well controlled. Vital signs are stable and clinically acceptable. Oxygen saturation is clinically acceptable. There are no apparent anesthetic complications at this time. Patient is ready for discharge.

## 2013-08-21 NOTE — Transfer of Care (Signed)
Immediate Anesthesia Transfer of Care Note  Patient: [name]  Procedure(s) Performed: Procedure(s) with comments: REPEAT CESAREAN SECTION (N/A) - REPEAT EDC 08/23/13  Patient Location: PACU  Anesthesia Type:Spinal  Level of Consciousness: awake and alert   Airway & Oxygen Therapy: Patient Spontanous Breathing  Post-op Assessment: Report given to PACU RN and Post -op Vital signs reviewed and stable  Post vital signs: Reviewed and stable  Complications: No apparent anesthesia complications

## 2013-08-21 NOTE — Anesthesia Procedure Notes (Signed)
Spinal  Patient location during procedure: OR Start time: 08/21/2013 9:42 AM Reason for block: procedure for pain Staffing Performed by: anesthesiologist  Preanesthetic Checklist Completed: patient identified, site marked, surgical consent, pre-op evaluation, timeout performed, IV checked, risks and benefits discussed and monitors and equipment checked Spinal Block Patient position: sitting Prep: site prepped and draped and DuraPrep Patient monitoring: heart rate, cardiac monitor, continuous pulse ox and blood pressure Approach: midline Location: L3-4 Injection technique: single-shot Needle Needle type: Pencan  Needle gauge: 24 G Needle length: 9 cm Assessment Sensory level: T4 Additional Notes Clear free flow CSF on first attempt.  No paresthesia.  Patient tolerated procedure well with no apparent complications.  Charlton Haws, MD

## 2013-08-21 NOTE — Anesthesia Postprocedure Evaluation (Signed)
Anesthesia Post Note  Patient: [name]  Procedure(s) Performed: Procedure(s) (LRB): REPEAT CESAREAN SECTION (N/A)  Anesthesia type: Spinal  Patient location: Mother/Baby  Post pain: Pain level controlled  Post assessment: Post-op Vital signs reviewed  Last Vitals:  Filed Vitals:   08/21/13 1500  BP: 137/78  Pulse: 61  Temp: 36.5 C  Resp: 20    Post vital signs: Reviewed  Level of consciousness: awake  Complications: No apparent anesthesia complications

## 2013-08-21 NOTE — Addendum Note (Signed)
Addendum created 08/21/13 1603 by Asher Muir, CRNA   Modules edited: Notes Section   Notes Section:  File: [id number]

## 2013-08-22 DIAGNOSIS — Z98891 History of uterine scar from previous surgery: Secondary | ICD-10-CM

## 2013-08-22 LAB — CBC
HCT: 34.9 % — ABNORMAL LOW (ref 36.0–46.0)
Hemoglobin: 11.7 g/dL — ABNORMAL LOW (ref 12.0–15.0)
MCH: 30.5 pg (ref 26.0–34.0)
MCHC: 33.5 g/dL (ref 30.0–36.0)
MCV: 91.1 fL (ref 78.0–100.0)
PLATELETS: 148 10*3/uL — AB (ref 150–400)
RBC: 3.83 MIL/uL — ABNORMAL LOW (ref 3.87–5.11)
RDW: 13.1 % (ref 11.5–15.5)
WBC: 11.4 10*3/uL — AB (ref 4.0–10.5)

## 2013-08-22 NOTE — Progress Notes (Signed)
Subjective: Postpartum Day 1: Cesarean Delivery Patient reports tolerating PO, + flatus and no problems voiding.    Objective: Vital signs in last 24 hours: Temp:  [97 F (36.1 C)-98.4 F (36.9 C)] 98.3 F (36.8 C) (06/20 0616) Pulse Rate:  [46-63] 62 (06/20 0616) Resp:  [12-20] 19 (06/20 0616) BP: (110-137)/(53-79) 112/78 mmHg (06/20 0616) SpO2:  [96 %-100 %] 100 % (06/20 0616) Weight:  [92.987 kg (205 lb)] 92.987 kg (205 lb) (06/19 1300)  Physical Exam:  General: alert, cooperative and no distress Lochia: appropriate Uterine Fundus: firm Incision: healing well DVT Evaluation: No evidence of DVT seen on physical exam.   Recent Labs  08/22/13 0605  HGB 11.7*  HCT 34.9*    Assessment/Plan: Status post Cesarean section. Doing well postoperatively.  Continue current care.  TOMBLIN II,JAMES E 08/22/2013, 9:22 AM

## 2013-08-22 NOTE — Plan of Care (Signed)
Problem: Phase II Progression Outcomes Goal: Progress activity as tolerated unless otherwise ordered Outcome: Completed/Met Date Met:  08/22/13 Ambulates in room well

## 2013-08-22 NOTE — Lactation Note (Signed)
This note was copied from the chart of [place]. Lactation Consultation Note  Patient Name: [name] [id number] Date: 08/22/2013 Reason for consult: Follow-up assessment Baby 34 hours of life. Mom had called for LC due to sore left nipple. Assisted mom to latch baby in football position. Baby initially had a a shallow latch, demonstrated to mom how to achieve deeper latch. Baby's lips flanged outward, bursts of rhythmic suckling with intermittent swallows noted. Mom reports increased comfort. Brought mom lanolin and comfort gels with instructions. Enc mom to use EBM on nipples, allow to dry, then use thin layer of lanolin. Enc mom to wear CGs in between nursing.   Maternal Data    Feeding Feeding Type: Breast Fed Length of feed: 15 min  LATCH Score/Interventions Latch: Grasps breast easily, tongue down, lips flanged, rhythmical sucking.  Audible Swallowing: Spontaneous and intermittent  Type of Nipple: Everted at rest and after stimulation  Comfort (Breast/Nipple): Filling, red/small blisters or bruises, mild/mod discomfort  Problem noted: Mild/Moderate discomfort  Hold (Positioning): Assistance needed to correctly position infant at breast and maintain latch. Intervention(s): Breastfeeding basics reviewed;Support Pillows;Position options  LATCH Score: 8  Lactation Tools Discussed/Used     Consult Status Consult Status: Follow-up Date: 08/23/13 Follow-up type: In-patient    Inocente Salles 08/22/2013, 8:39 PM

## 2013-08-23 MED ORDER — IBUPROFEN 600 MG PO TABS: 600.0000 mg | ORAL_TABLET | Freq: Four times a day (QID) | ORAL | Status: AC | PRN

## 2013-08-23 MED ORDER — OXYCODONE-ACETAMINOPHEN 5-325 MG PO TABS: 1.0000 | ORAL_TABLET | Freq: Four times a day (QID) | ORAL | Status: DC | PRN

## 2013-08-23 NOTE — Progress Notes (Signed)
Subjective: Postpartum Day 2: Cesarean Delivery Patient reports tolerating PO, + flatus and no problems voiding.    Objective: Vital signs in last 24 hours: Temp:  [98 F (36.7 C)-98.1 F (36.7 C)] 98 F (36.7 C) (06/21 0500) Pulse Rate:  [60] 60 (06/21 0500) Resp:  [18] 18 (06/21 0500) BP: (104-134)/(81-83) 104/83 mmHg (06/21 0500)  Physical Exam:  General: alert, cooperative and no distress Lochia: appropriate Uterine Fundus: firm Incision: healing well DVT Evaluation: No evidence of DVT seen on physical exam.   Recent Labs  08/22/13 0605  HGB 11.7*  HCT 34.9*    Assessment/Plan: Status post Cesarean section. Doing well postoperatively.  Discharge home with standard precautions and return to clinic in 4-6 weeks.  TOMBLIN II,JAMES E 08/23/2013, 9:22 AM

## 2013-08-23 NOTE — Discharge Summary (Signed)
Obstetric Discharge Summary Reason for Admission: cesarean section Prenatal Procedures: ultrasound Intrapartum Procedures: cesarean: low cervical, transverse Postpartum Procedures: none Complications-Operative and Postpartum: none Hemoglobin  Date Value Ref Range Status  08/22/2013 11.7* 12.0 - 15.0 g/dL Final  07/19/2011 12.2   Final     HCT  Date Value Ref Range Status  08/22/2013 34.9* 36.0 - 46.0 % Final  07/19/2011 36   Final    Physical Exam:  General: alert, cooperative and no distress Lochia: appropriate Uterine Fundus: firm Incision: healing well DVT Evaluation: No evidence of DVT seen on physical exam.  Discharge Diagnoses: Term Pregnancy-delivered  Discharge Information: Date: 08/23/2013 Activity: pelvic rest Diet: routine Medications: PNV, Ibuprofen and Percocet Condition: stable Instructions: refer to practice specific booklet Discharge to: home   Newborn Data: Live born female  Birth Weight: 7 lb 6.5 oz (3360 g) APGAR: 9, 9  Home with mother.  TOMBLIN II,JAMES E 08/23/2013, 9:25 AM

## 2013-08-23 NOTE — Lactation Note (Signed)
This note was copied from the chart of [place]. Lactation Consultation Note  Patient Name: [name] [id number] Date: 08/23/2013 Reason for consult: Follow-up assessment Mom reports baby is nursing well. Mom reports she has had sore nipples, positional stripes present, but since working with [facility] last night Mom reports she feels latch is better and nipple tenderness is improving. Mom had questions about engorgement and travel. Mom feel her breasts are filling. No engorgement reported however Mom had engorgement with her last baby who is 30 months old. Engorgement care reviewed, advised not to miss any feedings, pump as needed for comfort, use ice packs. Some suggestions given regarding travel in 3 weeks. Advised of OP services and support group. Encouraged Mom to call if she would like [place] assist with latch before d/c today.   Maternal Data    Feeding    LATCH Score/Interventions                      Lactation Tools Discussed/Used     Consult Status Consult Status: Complete Date: 08/23/13 Follow-up type: In-patient    Katrine Coho 08/23/2013, 9:56 AM

## 2013-08-24 ENCOUNTER — Encounter (HOSPITAL_COMMUNITY): Payer: Self-pay | Admitting: Obstetrics and Gynecology

## 2013-11-19 IMAGING — US US FETAL BPP W/O NONSTRESS
1 series · 14 of 16 positions shown · non-contrast
Comparison: none

[Series 1: us fetal bpp w/o nonstress · non-contrast · 16 acquisitions, 14 frames shown]
[im 1/16]
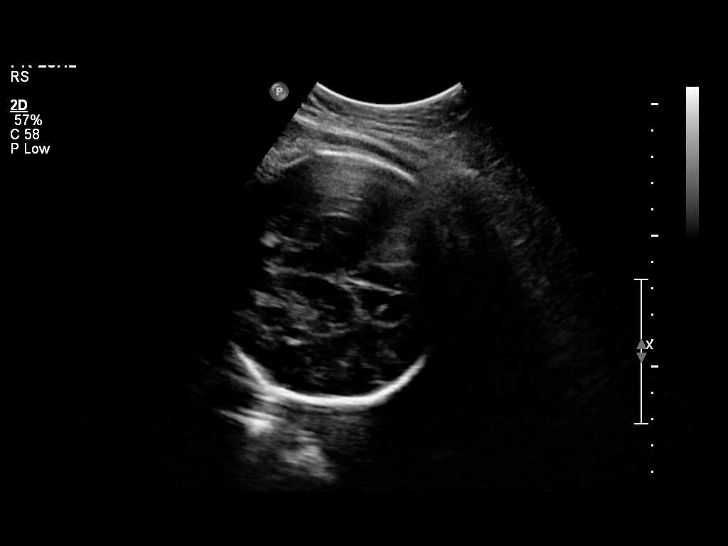
[im 2/16]
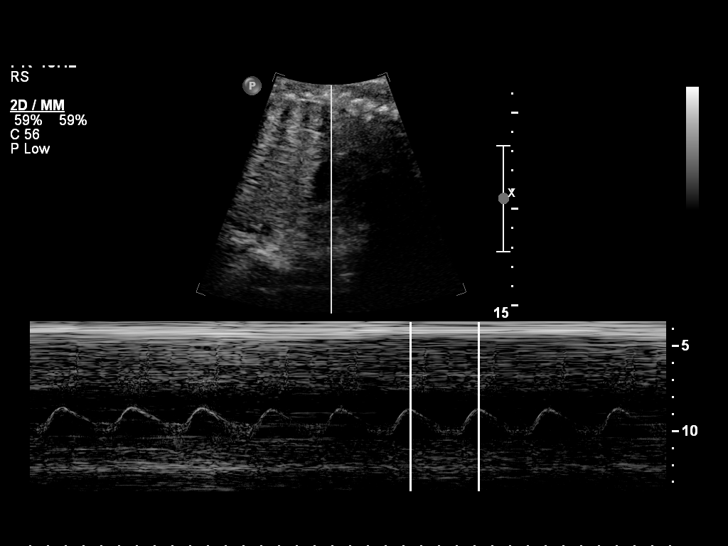
[im 3/16]
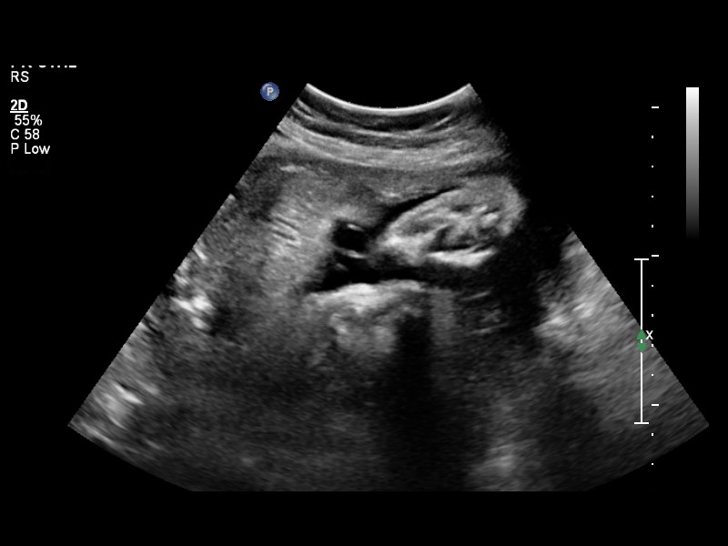
[im 5/16]
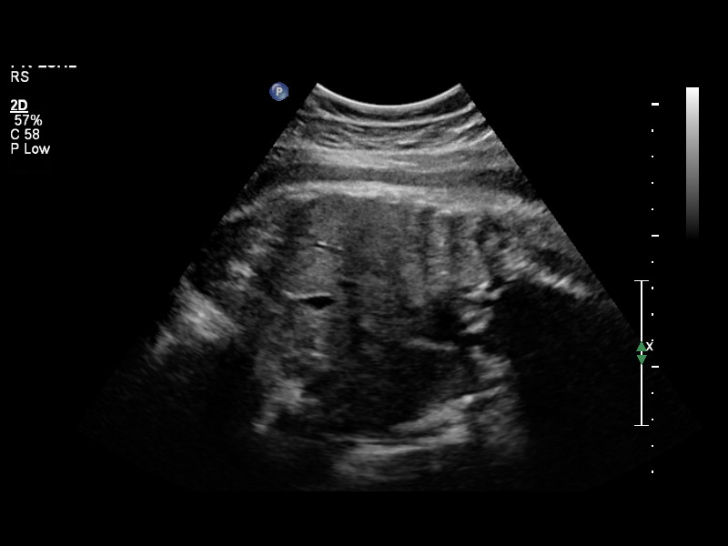
[im 6/16]
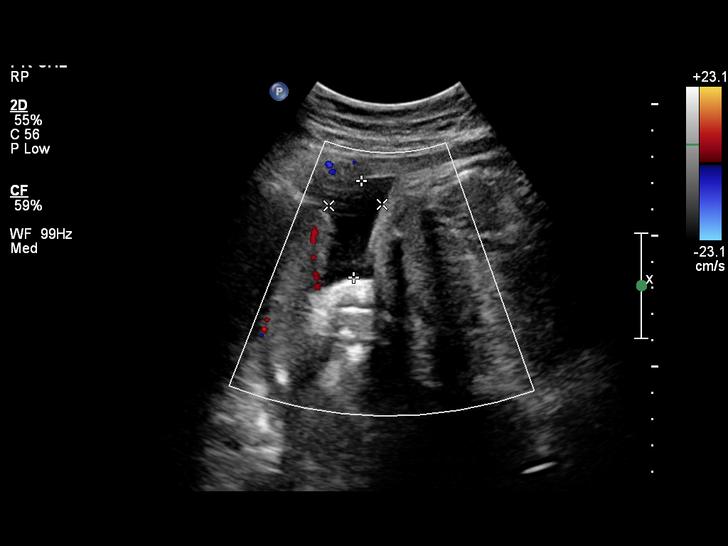
[im 7/16]
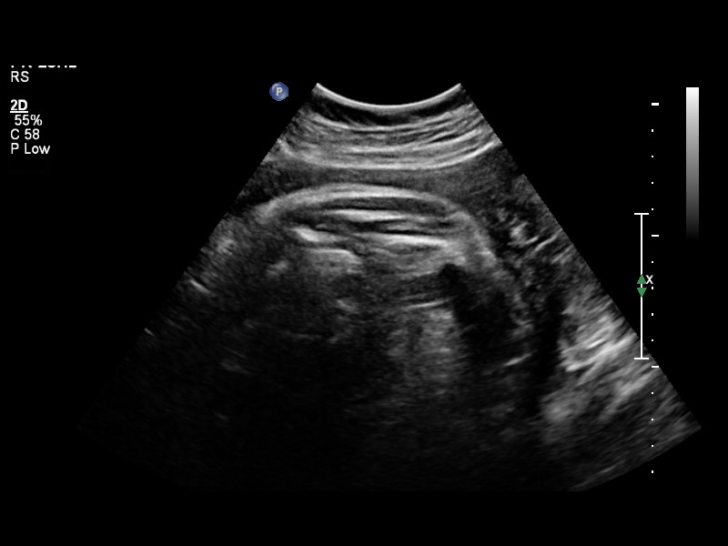
[im 8/16]
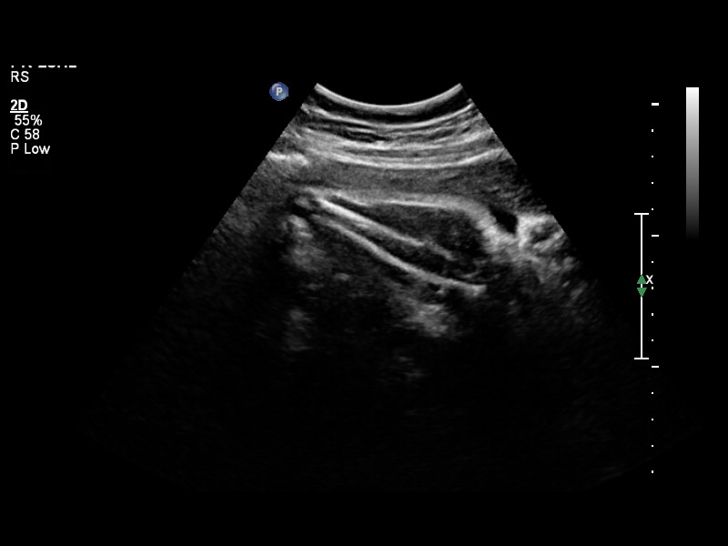
[im 9/16]
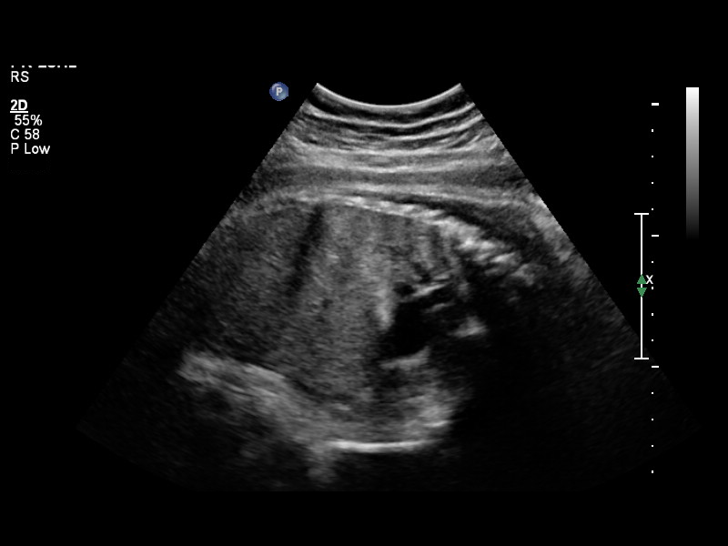
[im 10/16]
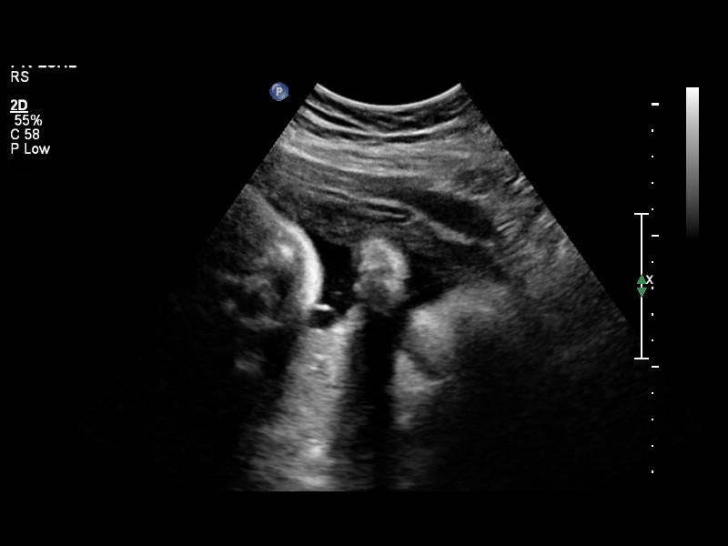
[im 11/16]
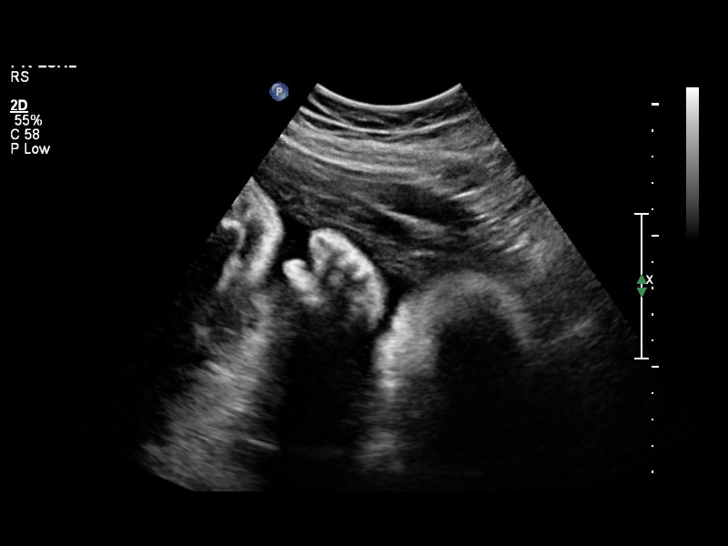
[im 13/16]
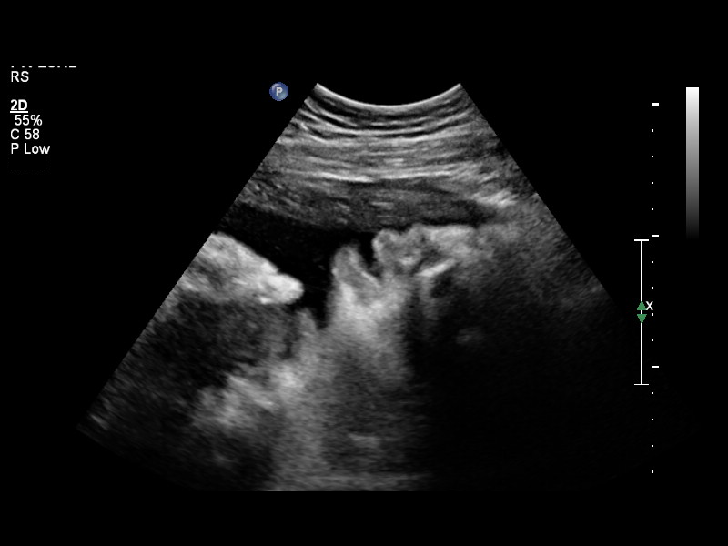
[im 14/16]
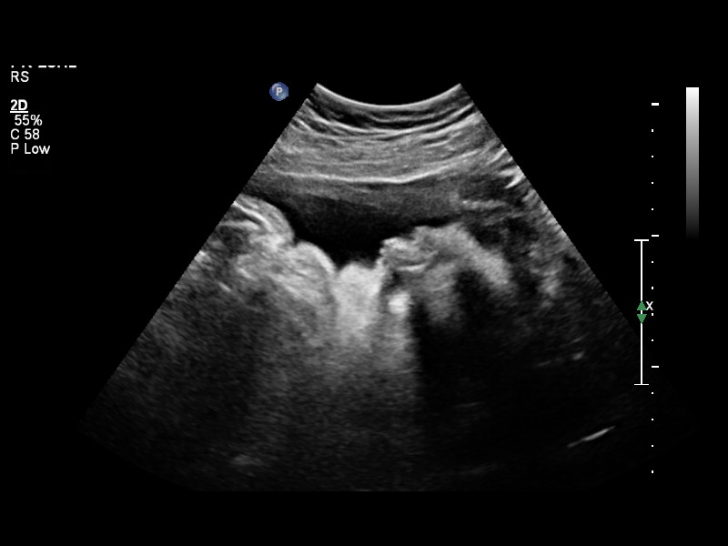
[im 15/16]
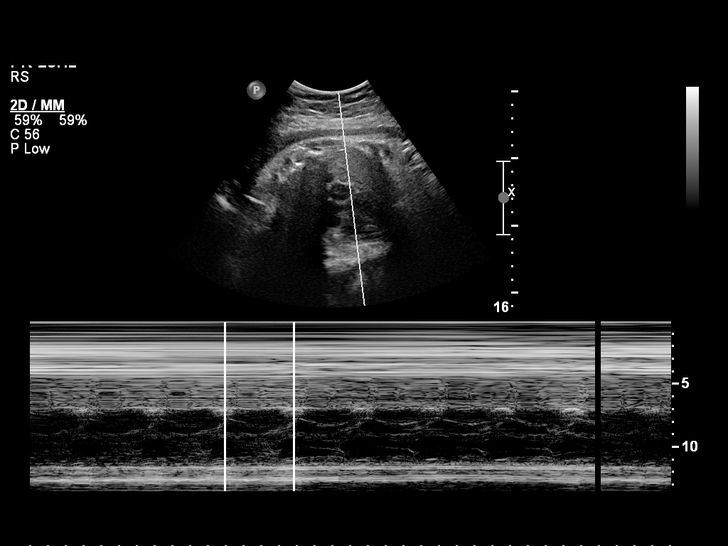
[im 16/16]
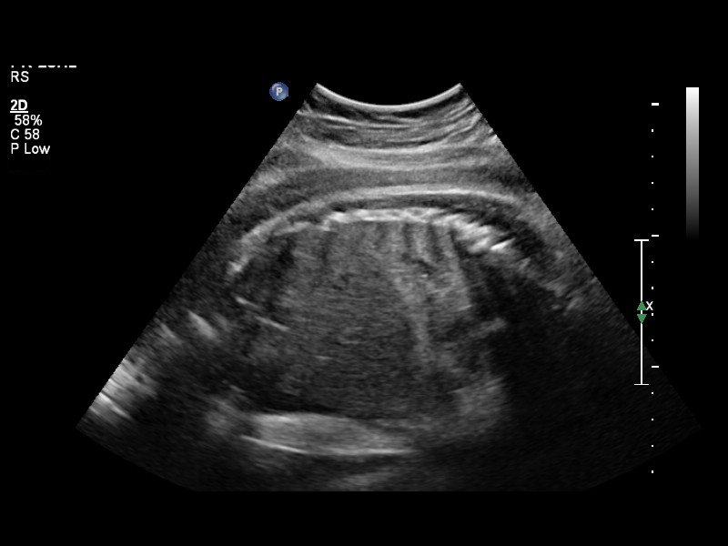

[14 of 16 positions shown; findings below may reference images not displayed]

OBSTETRICS REPORT
                      (Signed Final 02/22/2012 [DATE])

Service(s) Provided

Indications

 Non-reactive NST, FHR decelerations
 Hypertension - Gestational
Fetal Evaluation

 Num Of Fetuses:    1
 Fetal Heart Rate:  156                         bpm
 Cardiac Activity:  Observed
 Presentation:      Cephalic

 Comment:    BPP [DATE] in 20 minutes.

 Amniotic Fluid
 AFI FV:      Subjectively within normal limits
                                             Larg Pckt:     3.7  cm
Biophysical Evaluation

 Amniotic F.V:   Within normal limits       F. Tone:        Observed
 F. Movement:    Observed                   Score:          [DATE]
 F. Breathing:   Observed
Impression

 Single live IUP in cephalic presentation.  BPP [DATE].

 questions or concerns.

## 2014-01-04 ENCOUNTER — Encounter (HOSPITAL_COMMUNITY): Payer: Self-pay | Admitting: Obstetrics and Gynecology

## 2014-11-02 ENCOUNTER — Other Ambulatory Visit: Payer: Self-pay

## 2014-11-03 LAB — CYTOLOGY - PAP

## 2014-12-02 ENCOUNTER — Other Ambulatory Visit: Payer: Self-pay | Admitting: Physician Assistant

## 2015-08-09 ENCOUNTER — Other Ambulatory Visit (HOSPITAL_COMMUNITY)
Admission: RE | Admit: 2015-08-09 | Discharge: 2015-08-09 | Disposition: A | Discharge status: Home / Self Care | Payer: BLUE CROSS/BLUE SHIELD | Source: Ambulatory Visit | Attending: Family Medicine | Admitting: Family Medicine

## 2015-08-09 ENCOUNTER — Other Ambulatory Visit: Payer: Self-pay | Admitting: Family Medicine

## 2015-08-09 DIAGNOSIS — Z01419 Encounter for gynecological examination (general) (routine) without abnormal findings: Secondary | ICD-10-CM | POA: Diagnosis not present

## 2015-08-10 LAB — CYTOLOGY - PAP

## 2016-04-18 DIAGNOSIS — M9902 Segmental and somatic dysfunction of thoracic region: Secondary | ICD-10-CM | POA: Diagnosis not present

## 2016-04-18 DIAGNOSIS — M9903 Segmental and somatic dysfunction of lumbar region: Secondary | ICD-10-CM | POA: Diagnosis not present

## 2016-04-18 DIAGNOSIS — M5431 Sciatica, right side: Secondary | ICD-10-CM | POA: Diagnosis not present

## 2016-04-24 DIAGNOSIS — M9902 Segmental and somatic dysfunction of thoracic region: Secondary | ICD-10-CM | POA: Diagnosis not present

## 2016-04-24 DIAGNOSIS — M5431 Sciatica, right side: Secondary | ICD-10-CM | POA: Diagnosis not present

## 2016-04-24 DIAGNOSIS — M9903 Segmental and somatic dysfunction of lumbar region: Secondary | ICD-10-CM | POA: Diagnosis not present

## 2016-04-26 DIAGNOSIS — M9902 Segmental and somatic dysfunction of thoracic region: Secondary | ICD-10-CM | POA: Diagnosis not present

## 2016-04-26 DIAGNOSIS — M5431 Sciatica, right side: Secondary | ICD-10-CM | POA: Diagnosis not present

## 2016-04-26 DIAGNOSIS — M9903 Segmental and somatic dysfunction of lumbar region: Secondary | ICD-10-CM | POA: Diagnosis not present

## 2016-05-01 DIAGNOSIS — M5431 Sciatica, right side: Secondary | ICD-10-CM | POA: Diagnosis not present

## 2016-05-01 DIAGNOSIS — M9902 Segmental and somatic dysfunction of thoracic region: Secondary | ICD-10-CM | POA: Diagnosis not present

## 2016-05-01 DIAGNOSIS — M9903 Segmental and somatic dysfunction of lumbar region: Secondary | ICD-10-CM | POA: Diagnosis not present

## 2016-06-18 DIAGNOSIS — M5441 Lumbago with sciatica, right side: Secondary | ICD-10-CM | POA: Diagnosis not present

## 2016-06-25 DIAGNOSIS — M5441 Lumbago with sciatica, right side: Secondary | ICD-10-CM | POA: Diagnosis not present

## 2016-07-03 DIAGNOSIS — M5441 Lumbago with sciatica, right side: Secondary | ICD-10-CM | POA: Diagnosis not present

## 2016-07-09 DIAGNOSIS — M5441 Lumbago with sciatica, right side: Secondary | ICD-10-CM | POA: Diagnosis not present

## 2016-07-16 DIAGNOSIS — M5441 Lumbago with sciatica, right side: Secondary | ICD-10-CM | POA: Diagnosis not present

## 2016-07-23 DIAGNOSIS — M5441 Lumbago with sciatica, right side: Secondary | ICD-10-CM | POA: Diagnosis not present

## 2016-07-31 DIAGNOSIS — M5441 Lumbago with sciatica, right side: Secondary | ICD-10-CM | POA: Diagnosis not present

## 2016-08-13 ENCOUNTER — Other Ambulatory Visit: Payer: Self-pay | Admitting: Family Medicine

## 2016-08-13 ENCOUNTER — Other Ambulatory Visit (HOSPITAL_COMMUNITY)
Admission: RE | Admit: 2016-08-13 | Discharge: 2016-08-13 | Disposition: A | Discharge status: Home / Self Care | Payer: 59 | Source: Ambulatory Visit | Attending: Family Medicine | Admitting: Family Medicine

## 2016-08-13 DIAGNOSIS — Z01419 Encounter for gynecological examination (general) (routine) without abnormal findings: Secondary | ICD-10-CM | POA: Insufficient documentation

## 2016-08-13 DIAGNOSIS — E782 Mixed hyperlipidemia: Secondary | ICD-10-CM | POA: Diagnosis not present

## 2016-08-13 DIAGNOSIS — Z0000 Encounter for general adult medical examination without abnormal findings: Secondary | ICD-10-CM | POA: Diagnosis not present

## 2016-08-14 LAB — CYTOLOGY - PAP: Diagnosis: NEGATIVE

## 2016-08-16 DIAGNOSIS — M5441 Lumbago with sciatica, right side: Secondary | ICD-10-CM | POA: Diagnosis not present

## 2016-08-21 DIAGNOSIS — M5441 Lumbago with sciatica, right side: Secondary | ICD-10-CM | POA: Diagnosis not present

## 2016-08-27 DIAGNOSIS — M9903 Segmental and somatic dysfunction of lumbar region: Secondary | ICD-10-CM | POA: Diagnosis not present

## 2016-08-27 DIAGNOSIS — M5431 Sciatica, right side: Secondary | ICD-10-CM | POA: Diagnosis not present

## 2016-08-27 DIAGNOSIS — M9902 Segmental and somatic dysfunction of thoracic region: Secondary | ICD-10-CM | POA: Diagnosis not present

## 2016-08-28 DIAGNOSIS — M5117 Intervertebral disc disorders with radiculopathy, lumbosacral region: Secondary | ICD-10-CM | POA: Diagnosis not present

## 2016-08-29 DIAGNOSIS — M5441 Lumbago with sciatica, right side: Secondary | ICD-10-CM | POA: Diagnosis not present

## 2016-08-30 DIAGNOSIS — M5431 Sciatica, right side: Secondary | ICD-10-CM | POA: Diagnosis not present

## 2016-08-30 DIAGNOSIS — M9902 Segmental and somatic dysfunction of thoracic region: Secondary | ICD-10-CM | POA: Diagnosis not present

## 2016-08-30 DIAGNOSIS — M9903 Segmental and somatic dysfunction of lumbar region: Secondary | ICD-10-CM | POA: Diagnosis not present

## 2016-09-03 DIAGNOSIS — M5416 Radiculopathy, lumbar region: Secondary | ICD-10-CM | POA: Diagnosis not present

## 2016-09-04 DIAGNOSIS — M545 Low back pain: Secondary | ICD-10-CM | POA: Diagnosis not present

## 2016-09-04 DIAGNOSIS — M5431 Sciatica, right side: Secondary | ICD-10-CM | POA: Diagnosis not present

## 2016-09-04 DIAGNOSIS — M9903 Segmental and somatic dysfunction of lumbar region: Secondary | ICD-10-CM | POA: Diagnosis not present

## 2016-09-04 DIAGNOSIS — M9902 Segmental and somatic dysfunction of thoracic region: Secondary | ICD-10-CM | POA: Diagnosis not present

## 2016-09-04 DIAGNOSIS — M5116 Intervertebral disc disorders with radiculopathy, lumbar region: Secondary | ICD-10-CM | POA: Diagnosis not present

## 2016-09-17 DIAGNOSIS — M5441 Lumbago with sciatica, right side: Secondary | ICD-10-CM | POA: Diagnosis not present

## 2016-09-21 DIAGNOSIS — M5416 Radiculopathy, lumbar region: Secondary | ICD-10-CM | POA: Diagnosis not present

## 2016-09-27 DIAGNOSIS — M47816 Spondylosis without myelopathy or radiculopathy, lumbar region: Secondary | ICD-10-CM | POA: Diagnosis not present

## 2016-09-27 DIAGNOSIS — M5126 Other intervertebral disc displacement, lumbar region: Secondary | ICD-10-CM | POA: Diagnosis not present

## 2016-09-27 DIAGNOSIS — M5136 Other intervertebral disc degeneration, lumbar region: Secondary | ICD-10-CM | POA: Diagnosis not present

## 2016-10-08 DIAGNOSIS — M5416 Radiculopathy, lumbar region: Secondary | ICD-10-CM | POA: Diagnosis not present

## 2016-10-09 DIAGNOSIS — M5117 Intervertebral disc disorders with radiculopathy, lumbosacral region: Secondary | ICD-10-CM | POA: Diagnosis not present

## 2016-10-09 DIAGNOSIS — M5417 Radiculopathy, lumbosacral region: Secondary | ICD-10-CM | POA: Diagnosis not present

## 2016-11-22 DIAGNOSIS — M5441 Lumbago with sciatica, right side: Secondary | ICD-10-CM | POA: Diagnosis not present

## 2016-11-26 DIAGNOSIS — M5441 Lumbago with sciatica, right side: Secondary | ICD-10-CM | POA: Diagnosis not present

## 2016-12-03 DIAGNOSIS — M5441 Lumbago with sciatica, right side: Secondary | ICD-10-CM | POA: Diagnosis not present

## 2016-12-05 DIAGNOSIS — M5441 Lumbago with sciatica, right side: Secondary | ICD-10-CM | POA: Diagnosis not present

## 2016-12-12 DIAGNOSIS — M5441 Lumbago with sciatica, right side: Secondary | ICD-10-CM | POA: Diagnosis not present

## 2016-12-20 DIAGNOSIS — M5441 Lumbago with sciatica, right side: Secondary | ICD-10-CM | POA: Diagnosis not present

## 2016-12-26 DIAGNOSIS — M545 Low back pain: Secondary | ICD-10-CM | POA: Diagnosis not present

## 2016-12-28 DIAGNOSIS — M545 Low back pain: Secondary | ICD-10-CM | POA: Diagnosis not present

## 2017-01-03 DIAGNOSIS — M5441 Lumbago with sciatica, right side: Secondary | ICD-10-CM | POA: Diagnosis not present

## 2017-01-17 DIAGNOSIS — M5441 Lumbago with sciatica, right side: Secondary | ICD-10-CM | POA: Diagnosis not present

## 2017-01-18 DIAGNOSIS — R42 Dizziness and giddiness: Secondary | ICD-10-CM | POA: Diagnosis not present

## 2017-01-22 DIAGNOSIS — L814 Other melanin hyperpigmentation: Secondary | ICD-10-CM | POA: Diagnosis not present

## 2017-01-22 DIAGNOSIS — D225 Melanocytic nevi of trunk: Secondary | ICD-10-CM | POA: Diagnosis not present

## 2017-01-22 DIAGNOSIS — D229 Melanocytic nevi, unspecified: Secondary | ICD-10-CM | POA: Diagnosis not present

## 2017-01-31 DIAGNOSIS — M5441 Lumbago with sciatica, right side: Secondary | ICD-10-CM | POA: Diagnosis not present

## 2017-02-14 DIAGNOSIS — M5441 Lumbago with sciatica, right side: Secondary | ICD-10-CM | POA: Diagnosis not present

## 2021-04-19 ENCOUNTER — Other Ambulatory Visit: Payer: Self-pay

## 2021-04-19 ENCOUNTER — Other Ambulatory Visit: Payer: Self-pay | Admitting: Family Medicine

## 2021-04-19 ENCOUNTER — Ambulatory Visit
Admission: RE | Admit: 2021-04-19 | Discharge: 2021-04-19 | Disposition: A | Discharge status: Home / Self Care | Payer: BLUE CROSS/BLUE SHIELD | Source: Ambulatory Visit | Attending: Family Medicine | Admitting: Family Medicine

## 2021-04-19 DIAGNOSIS — J189 Pneumonia, unspecified organism: Secondary | ICD-10-CM

## 2023-03-07 ENCOUNTER — Telehealth: Payer: Self-pay | Admitting: Physician Assistant

## 2023-03-07 NOTE — Telephone Encounter (Signed)
 Rescheduled appointments per patients request via incoming call. Patient is aware of the changes made to her upcoming appointments.

## 2023-03-18 ENCOUNTER — Other Ambulatory Visit: Payer: BLUE CROSS/BLUE SHIELD

## 2023-03-18 ENCOUNTER — Encounter: Payer: BLUE CROSS/BLUE SHIELD | Admitting: Nurse Practitioner

## 2023-03-31 ENCOUNTER — Other Ambulatory Visit: Payer: Self-pay | Admitting: Physician Assistant

## 2023-03-31 DIAGNOSIS — R7989 Other specified abnormal findings of blood chemistry: Secondary | ICD-10-CM

## 2023-03-31 NOTE — Progress Notes (Unsigned)
[facility] [phone]   [phone]  CONSULT NOTE  REFERRING PHYSICIAN: Eugenie Birks FNP  REASON FOR CONSULTATION:  Elevated ferritin   HPI [name] is a 43 y.o. female with a past medical history significant for herniated disc is referred to the clinic for elevated ferritin.   The patient saw her GYN on 01/24/23 for annual exam. She had also been endorsing hair loss. Therefore, her GYN added on some extra labs including Vitamin d, ferritin, and thyroid studies. Her CBC was normal except for slightly elevated MCV of 98 and her ferritin was elevated at 377. She then rechecked this on 02/14/23 which continued to be elevated at 404. 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 Due to the macrocytosis, her B12 and folate were checked which were normal.   Due to his persistently elevated ferritin, she was referred to clinic. Overall, she feels good. She exercises on a regular basis with running and cross fit. She denies any usual joint pain, except in her toes. She does not take a dedicated iron supplement except she does take a women's once a day multivitamin. From looking up the label, this may contain 18 mg of iron. She has an IUD so she does not have a menstrual cycle. She also takes biotin for her hair. Otherwise, she does not take any other medications on a regular basis. Denies hyperpigmentation of the skin or bronzing. She denies any known liver disease. Of note, her recent CMP was unremarkable. She does drink over the recommended weekly intake of alcohol with approximately 20 drinks per week. However, since the new year, she has been trying to cut back and cut back on red meat. She has been drinking smoothies which contain some spinach. Denies any known heart disease except she had rheumatic fever as a child and had to follow with cardiology for 5 years.  She denies any inflammatory of  autoimmune disorders. She denies significant allergies.  He denies any fever, chills, or unexplained weight loss.  She may have night sweats at night secondary to warm sleeping environment with blankets.  Denies any nausea, vomiting, diarrhea, skin infections, dysuria, cough, sore throat, jaundice, shortness of breath.  Her family history is consistent with a grandmother who had hemochromatosis and died in her 45s unclear if related to either bladder cancer or liver failure from cirrhosis/hemochromatosis.  The patient states her mother was tested and was reportedly negative although it is unclear if she was a carrier or not.  The patient's paternal grandmother had ovarian cancer.  Her other grandfather had skin cancer.  Her father has dementia.  The patient works as a Human resources officer.  She is married.  She has 2 children.  As previously mentioned, she does drink approximately 20 alcoholic beverages per week, more so during football season.  She denies any smoking or drug use.  HPI  Past Medical History:  Diagnosis Date   Anxiety state    unspec   Fracture    ankle   HLD (hyperlipidemia)    Infection    uti   Pregnancy induced hypertension    Rheumatic fever    saw cardiologist at [facility] as a child for possible rheumatic fever    Past Surgical History:  Procedure Laterality Date   CESAREAN SECTION  02/23/2012   Procedure: CESAREAN SECTION;  Surgeon: Leslie Andrea, MD;  Location: [facility];  Service: Obstetrics;  Laterality: N/A;  Primary  Cesarean Section   CESAREAN SECTION N/A 08/21/2013   Procedure: REPEAT CESAREAN SECTION;  Surgeon: Leslie Andrea, MD;  Location: [facility];  Service: Obstetrics;  Laterality: N/A;  REPEAT EDC 08/23/13   wisdom teeth removal      Family History  Problem Relation Age of Onset   Hypertension Father    Hyperlipidemia Father    Diabetes Father    Other Neg Hx     Social History Social History   Tobacco Use   Smoking status: Never    Smokeless tobacco: Never   Tobacco comments:    non-smoker   Substance Use Topics   Alcohol use: Yes    Comment: none with preg   Drug use: No    No Known Allergies  Current Outpatient Medications  Medication Sig Dispense Refill   acetaminophen (TYLENOL) 500 MG tablet Take 1,000 mg by mouth every 6 (six) hours as needed for headache.     ibuprofen (ADVIL,MOTRIN) 600 MG tablet Take 1 tablet (600 mg total) by mouth every 6 (six) hours as needed. 30 tablet 0   Prenatal Vit-Fe Fumarate-FA (PRENATAL MULTIVITAMIN) TABS Take 1 tablet by mouth at bedtime.     No current facility-administered medications for this visit.    REVIEW OF SYSTEMS:   Review of Systems  Constitutional: Negative for appetite change, chills, fatigue, fever and unexpected weight change.  HENT: Negative for mouth sores, nosebleeds, sore throat and trouble swallowing.   Eyes: Negative for eye problems and icterus.  Respiratory: Negative for cough, hemoptysis, shortness of breath and wheezing.   Cardiovascular: Negative for chest pain and leg swelling.  Gastrointestinal: Negative for abdominal pain, constipation, diarrhea, nausea and vomiting.  Genitourinary: Negative for bladder incontinence, difficulty urinating, dysuria, frequency and hematuria.   Musculoskeletal: Negative for back pain, gait problem, neck pain and neck stiffness.  Skin: Negative for itching and rash.  Neurological: Negative for dizziness, extremity weakness, gait problem, headaches, light-headedness and seizures.  Hematological: Negative for adenopathy. Does not bruise/bleed easily.  Psychiatric/Behavioral: Negative for confusion, depression and sleep disturbance. The patient is not nervous/anxious.     PHYSICAL EXAMINATION:  Blood pressure 127/70, pulse 72, temperature 98.3 F (36.8 C), temperature source Temporal, resp. rate 14, weight 171 lb 4.8 oz (77.7 kg), SpO2 100%, unknown if currently breastfeeding.  ECOG PERFORMANCE STATUS:  0  Physical Exam  Constitutional: Oriented to person, place, and time and well-developed, well-nourished, and in no distress.  HENT:  Head: Normocephalic and atraumatic.  Mouth/Throat: Oropharynx is clear and moist. No oropharyngeal exudate.  Eyes: Conjunctivae are normal. Right eye exhibits no discharge. Left eye exhibits no discharge. No scleral icterus.  Neck: Normal range of motion. Neck supple.  Cardiovascular: Normal rate, regular rhythm, normal heart sounds and intact distal pulses.   Pulmonary/Chest: Effort normal and breath sounds normal. No respiratory distress. No wheezes. No rales.  Abdominal: Soft. Bowel sounds are normal. Exhibits no distension and no mass. There is no tenderness.  Musculoskeletal: Normal range of motion. Exhibits no edema.  Lymphadenopathy:    No cervical adenopathy.  Neurological: Alert and oriented to person, place, and time. Exhibits normal muscle tone. Gait normal. Coordination normal.  Skin: Skin is warm and dry. No rash noted. Not diaphoretic. No erythema. No pallor.  Psychiatric: Mood, memory and judgment normal.  Vitals reviewed.  LABORATORY DATA: Lab Results  Component Value Date   WBC 7.9 04/01/2023   HGB 12.8 04/01/2023   HCT 39.2 04/01/2023   MCV 100.0 04/01/2023  PLT 218 04/01/2023      Chemistry      Component Value Date/Time   NA 136 04/01/2023 1305   K 4.3 04/01/2023 1305   CL 101 04/01/2023 1305   CO2 31 04/01/2023 1305   BUN 13 04/01/2023 1305   CREATININE 0.87 04/01/2023 1305   CREATININE 0.67 12/10/2012 0919      Component Value Date/Time   CALCIUM 9.6 04/01/2023 1305   ALKPHOS 49 04/01/2023 1305   AST 21 04/01/2023 1305   ALT 18 04/01/2023 1305   BILITOT 0.5 04/01/2023 1305       RADIOGRAPHIC STUDIES: No results found.  ASSESSMENT: This is a very pleasant 43 year old Caucasian female referred to the clinic for elevated ferritin. She is heterozygous for hemochromatosis.   The patient had repeat labs today  including CBC, CMP, ferritin, and iron studies.   The patient was seen with Dr. Arbutus Ped.  Dr. Arbutus Ped explained that in general patients heterozygous for hemochromatosis generally do not manifest symptoms of iron overload.  Her iron studies did show elevated iron and saturation today.  We are waiting for the results of her ferritin.  In general we recommend seeing her back for follow-up visit in 3 months with repeat iron studies, ferritin, CBC, and CMP while cutting on any vitamins that contain iron.   However, if her ferritin comes back more elevated or concerning today, we may consider her for phlebotomy.  Will send her a MyChart message once I have the results of her ferritin.  Would recommend that she cut back on any excessive iron rich food intake.  Additionally she was advised to cut back on alcohol use.   Since she is a carrier for hemochromatosis, she may want to consider having her husband get tested.  If he is also a carrier then her children will need to be tested.   The patient voices understanding of current disease status and treatment options and is in agreement with the current care plan.  All questions were answered. The patient knows to call the clinic with any problems, questions or concerns. We can certainly see the patient much sooner if necessary.  Thank you so much for allowing me to participate in the care of HERTHA GERGEN. I will continue to follow up the patient with you and assist in her care.   Disclaimer: This note was dictated with voice recognition software. Similar sounding words can inadvertently be transcribed and may not be corrected upon review.   Vinetta Brach L Elissa Grieshop April 01, 2023, 2:06 PM  ADDENDUM: Hematology/Oncology Attending: I had a face-to-face encounter with the patient today.  I reviewed her records, lab and recommended her care plan.  This is a very pleasant 44 years old white female with history of herniated disc and positive  heterozygous H63D hemochromatosis gene.  The patient was seen by her gynecologist for routine evaluation in November 2024 and has been complaining of hair loss.  She had several studies performed including vitamin D, ferritin, thyroid studies in addition to CBC.  She was found to have elevated MCV of 98 and also elevated ferritin level of 377.  Repeat ferritin level on February 14, 2023 was further elevated to 404.  She was tested for hemochromatosis and her test was positive for heterozygous H63D.  The patient had normal vitamin B12 and serum folate.  She was referred to [place] today for evaluation and recommendation regarding her condition.  The patient is feeling fine with no concerning issues she  has IUD and did not have any menstrual cycle for a while.  She also takes biotin for her hair.  She drinks alcohol at regular basis averaging 20 drinks per week.  She also takes multivitamins with Centrum One-A-Day for women.  Her family history is significant for grandmother who had hemochromatosis and died from liver failure and cirrhosis. The patient mentioned that her mother was also tested and she was negative for hemochromatosis but she is not sure if she was heterozygous carrier. When seen today she is feeling fine with no concerning issues. Repeat blood work today showed normal CBC and elevated blood glucose 144 on the comprehensive metabolic panel.  Her iron studies showed elevated serum iron of 187 with iron saturation of 63%.  Ferritin level was normal at 221. I recommended for the patient to decrease the intake of any supplemental iron and her multivitamins and also to avoid diets that are rich in iron. I also strongly encouraged her to decrease the amount of alcohol she drinks at regular basis. Will continue to monitor her for now with repeat CBC, iron study and ferritin in 3 months.  If she continues to have significant elevation of serum ferritin or iron study, we may consider her for phlebotomy in the  future but she does not have the full-blown picture of hemochromatosis that require strict monitoring and keeping low level of ferritin. The patient was advised to call immediately if she has any other concerning symptoms in the interval. The total time spent in the appointment was 60 minutes. Disclaimer: This note was dictated with voice recognition software. Similar sounding words can inadvertently be transcribed and may be missed upon review. Lajuana Matte, MD

## 2023-04-01 ENCOUNTER — Inpatient Hospital Stay: Payer: 59

## 2023-04-01 ENCOUNTER — Inpatient Hospital Stay: Payer: 59 | Attending: Physician Assistant | Admitting: Physician Assistant

## 2023-04-01 VITALS — BP 127/70 | HR 72 | Temp 98.3°F | Resp 14 | Wt 171.3 lb

## 2023-04-01 DIAGNOSIS — Z808 Family history of malignant neoplasm of other organs or systems: Secondary | ICD-10-CM | POA: Insufficient documentation

## 2023-04-01 DIAGNOSIS — R7989 Other specified abnormal findings of blood chemistry: Secondary | ICD-10-CM

## 2023-04-01 DIAGNOSIS — D509 Iron deficiency anemia, unspecified: Secondary | ICD-10-CM | POA: Insufficient documentation

## 2023-04-01 DIAGNOSIS — Z8041 Family history of malignant neoplasm of ovary: Secondary | ICD-10-CM | POA: Diagnosis not present

## 2023-04-01 DIAGNOSIS — F10939 Alcohol use, unspecified with withdrawal, unspecified: Secondary | ICD-10-CM | POA: Diagnosis not present

## 2023-04-01 DIAGNOSIS — Z148 Genetic carrier of other disease: Secondary | ICD-10-CM | POA: Diagnosis not present

## 2023-04-01 DIAGNOSIS — M5431 Sciatica, right side: Secondary | ICD-10-CM | POA: Insufficient documentation

## 2023-04-01 DIAGNOSIS — R030 Elevated blood-pressure reading, without diagnosis of hypertension: Secondary | ICD-10-CM | POA: Insufficient documentation

## 2023-04-01 DIAGNOSIS — E782 Mixed hyperlipidemia: Secondary | ICD-10-CM | POA: Insufficient documentation

## 2023-04-01 LAB — CMP (CANCER CENTER ONLY)
ALT: 18 U/L (ref 0–44)
AST: 21 U/L (ref 15–41)
Albumin: 4.6 g/dL (ref 3.5–5.0)
Alkaline Phosphatase: 49 U/L (ref 38–126)
Anion gap: 4 — ABNORMAL LOW (ref 5–15)
BUN: 13 mg/dL (ref 6–20)
CO2: 31 mmol/L (ref 22–32)
Calcium: 9.6 mg/dL (ref 8.9–10.3)
Chloride: 101 mmol/L (ref 98–111)
Creatinine: 0.87 mg/dL (ref 0.44–1.00)
GFR, Estimated: >60 mL/min (ref >60)
Glucose, Bld: 144 mg/dL — ABNORMAL HIGH (ref 70–99)
Potassium: 4.3 mmol/L (ref 3.5–5.1)
Sodium: 136 mmol/L (ref 135–145)
Total Bilirubin: 0.5 mg/dL (ref 0.0–1.2)
Total Protein: 7.5 g/dL (ref 6.5–8.1)

## 2023-04-01 LAB — CBC WITH DIFFERENTIAL (CANCER CENTER ONLY)
Abs Immature Granulocytes: 0.02 10*3/uL (ref 0.00–0.07)
Basophils Absolute: 0.1 10*3/uL (ref 0.0–0.1)
Basophils Relative: 1 %
Eosinophils Absolute: 0.1 10*3/uL (ref 0.0–0.5)
Eosinophils Relative: 1 %
HCT: 39.2 % (ref 36.0–46.0)
Hemoglobin: 12.8 g/dL (ref 12.0–15.0)
Immature Granulocytes: 0 %
Lymphocytes Relative: 14 %
Lymphs Abs: 1.1 10*3/uL (ref 0.7–4.0)
MCH: 32.7 pg (ref 26.0–34.0)
MCHC: 32.7 g/dL (ref 30.0–36.0)
MCV: 100 fL (ref 80.0–100.0)
Monocytes Absolute: 0.4 10*3/uL (ref 0.1–1.0)
Monocytes Relative: 5 %
Neutro Abs: 6.2 10*3/uL (ref 1.7–7.7)
Neutrophils Relative %: 79 %
Platelet Count: 218 10*3/uL (ref 150–400)
RBC: 3.92 MIL/uL (ref 3.87–5.11)
RDW: 12.4 % (ref 11.5–15.5)
WBC Count: 7.9 10*3/uL (ref 4.0–10.5)
nRBC: 0 % (ref 0.0–0.2)

## 2023-04-01 LAB — IRON AND IRON BINDING CAPACITY (CC-WL,HP ONLY)
Iron: 187 ug/dL — ABNORMAL HIGH (ref 28–170)
Saturation Ratios: 63 % — ABNORMAL HIGH (ref 10.4–31.8)
TIBC: 295 ug/dL (ref 250–450)
UIBC: 108 ug/dL — ABNORMAL LOW (ref 148–442)

## 2023-04-01 LAB — FERRITIN: Ferritin: 221 ng/mL (ref 11–307)

## 2023-04-02 ENCOUNTER — Encounter: Payer: Self-pay | Admitting: Physician Assistant

## 2023-06-24 ENCOUNTER — Telehealth: Payer: Self-pay | Admitting: Internal Medicine

## 2023-06-24 NOTE — Progress Notes (Unsigned)
 [facility] OFFICE PROGRESS NOTE  Clements, Vyvyan, MD [phone] [name] [place] [address]  DIAGNOSIS: This is a very pleasant 43 year old Caucasian female referred to the clinic for elevated ferritin. She is heterozygous for hemochromatosis.   PRIOR THERAPY: None  CURRENT THERAPY: Reducing intake of iron rich food  INTERVAL HISTORY: [name] 43 y.o. female returns to the clinic today for follow-up visit.  The patient was referred to the clinic for elevated ferritin.  She is heterozygous for hemochromatosis.  However, generally and would like this patient's to not require treatment for hemochromatosis.  Her iron studies at her last appointment were elevated but her ferritin continue to downtrend.  Therefore we decided not to proceed with a phlebotomy at that time.  Recommended low iron diet and avoiding multivitamins.  She is here for 39-month follow-up check.  Overall she denies any major changes in her health.  She is healthy and exercises on a regular basis with running and CrossFit.  She denies any unusual fatigue.  She has an IUD so she does not have regular menstrual cycles.  She does not take any other medications on a regular basis.  She denies any hyperpigmentation or bronzing of the skin.  Denies liver disease.  She does drink over the recommended intake of alcohol and since her last appointment she has ***.  He also had been drinking smoothies that contain spinach and has ***reduced her intake of this.  She denies any other inflammatory or autoimmune disorders.  She denies any allergies.  Denies any fever, chills, or unexplained weight loss.  She denies any nausea, vomiting, diarrhea, skin infections, dysuria, cough, sore throat, jaundice, or itching.  She denies any unusual joint pain.  She is here today for evaluation and repeat blood work. MEDICAL HISTORY: Past Medical History:  Diagnosis Date   Anxiety state    unspec   Fracture    ankle   HLD  (hyperlipidemia)    Infection    uti   Pregnancy induced hypertension    Rheumatic fever    saw cardiologist at [facility] as a child for possible rheumatic fever    ALLERGIES:  has no known allergies.  MEDICATIONS:  Current Outpatient Medications  Medication Sig Dispense Refill   acetaminophen  (TYLENOL ) 500 MG tablet Take 1,000 mg by mouth every 6 (six) hours as needed for headache.     ibuprofen  (ADVIL ,MOTRIN ) 600 MG tablet Take 1 tablet (600 mg total) by mouth every 6 (six) hours as needed. 30 tablet 0   Prenatal Vit-Fe Fumarate-FA (PRENATAL MULTIVITAMIN) TABS Take 1 tablet by mouth at bedtime.     No current facility-administered medications for this visit.    SURGICAL HISTORY:  Past Surgical History:  Procedure Laterality Date   CESAREAN SECTION  02/23/2012   Procedure: CESAREAN SECTION;  Surgeon: Jeanmarie Millet, MD;  Location: [facility];  Service: Obstetrics;  Laterality: N/A;  Primary Cesarean Section   CESAREAN SECTION N/A 08/21/2013   Procedure: REPEAT CESAREAN SECTION;  Surgeon: Jeanmarie Millet, MD;  Location: [facility];  Service: Obstetrics;  Laterality: N/A;  REPEAT EDC 08/23/13   wisdom teeth removal      REVIEW OF SYSTEMS:   Review of Systems  Constitutional: Negative for appetite change, chills, fatigue, fever and unexpected weight change.  HENT:   Negative for mouth sores, nosebleeds, sore throat and trouble swallowing.   Eyes: Negative for eye problems and icterus.  Respiratory: Negative for  cough, hemoptysis, shortness of breath and wheezing.   Cardiovascular: Negative for chest pain and leg swelling.  Gastrointestinal: Negative for abdominal pain, constipation, diarrhea, nausea and vomiting.  Genitourinary: Negative for bladder incontinence, difficulty urinating, dysuria, frequency and hematuria.   Musculoskeletal: Negative for back pain, gait problem, neck pain and neck stiffness.  Skin: Negative for itching and rash.  Neurological: Negative for dizziness,  extremity weakness, gait problem, headaches, light-headedness and seizures.  Hematological: Negative for adenopathy. Does not bruise/bleed easily.  Psychiatric/Behavioral: Negative for confusion, depression and sleep disturbance. The patient is not nervous/anxious.     PHYSICAL EXAMINATION:  unknown if currently breastfeeding.  ECOG PERFORMANCE STATUS: {CHL ONC ECOG [id number]  Physical Exam  Constitutional: Oriented to person, place, and time and well-developed, well-nourished, and in no distress. No distress.  HENT:  Head: Normocephalic and atraumatic.  Mouth/Throat: Oropharynx is clear and moist. No oropharyngeal exudate.  Eyes: Conjunctivae are normal. Right eye exhibits no discharge. Left eye exhibits no discharge. No scleral icterus.  Neck: Normal range of motion. Neck supple.  Cardiovascular: Normal rate, regular rhythm, normal heart sounds and intact distal pulses.   Pulmonary/Chest: Effort normal and breath sounds normal. No respiratory distress. No wheezes. No rales.  Abdominal: Soft. Bowel sounds are normal. Exhibits no distension and no mass. There is no tenderness.  Musculoskeletal: Normal range of motion. Exhibits no edema.  Lymphadenopathy:    No cervical adenopathy.  Neurological: Alert and oriented to person, place, and time. Exhibits normal muscle tone. Gait normal. Coordination normal.  Skin: Skin is warm and dry. No rash noted. Not diaphoretic. No erythema. No pallor.  Psychiatric: Mood, memory and judgment normal.  Vitals reviewed.  LABORATORY DATA: Lab Results  Component Value Date   WBC 7.9 04/01/2023   HGB 12.8 04/01/2023   HCT 39.2 04/01/2023   MCV 100.0 04/01/2023   PLT 218 04/01/2023      Chemistry      Component Value Date/Time   NA 136 04/01/2023 1305   K 4.3 04/01/2023 1305   CL 101 04/01/2023 1305   CO2 31 04/01/2023 1305   BUN 13 04/01/2023 1305   CREATININE 0.87 04/01/2023 1305   CREATININE 0.67 12/10/2012 0919      Component Value  Date/Time   CALCIUM 9.6 04/01/2023 1305   ALKPHOS 49 04/01/2023 1305   AST 21 04/01/2023 1305   ALT 18 04/01/2023 1305   BILITOT 0.5 04/01/2023 1305       RADIOGRAPHIC STUDIES:  No results found.   ASSESSMENT/PLAN:  This is a very pleasant 43 year old Caucasian female referred to the clinic for elevated ferritin. She is heterozygous for hemochromatosis.    The patient had repeat labs today including CBC, CMP, ferritin, and iron studies.    Dr. Marguerita Shih explained at her prior appointment that in general patients heterozygous for hemochromatosis generally do not manifest symptoms of iron overload. Her CBC today shows ***. Her iron studies and ferritin show ***.    In general we recommend seeing her back for follow-up visit in 3**** months with repeat iron studies, ferritin, CBC, and CMP while cutting on any vitamins that contain iron.    However, if her ferritin comes back more elevated or concerning today, we may consider her for phlebotomy.***  Will send her a MyChart message once I have the results of her ferritin.   Would recommend that she cut back on any excessive iron rich food intake.  Additionally she was advised to cut back on alcohol use.  The patient was advised to call immediately if she has any concerning symptoms in the interval. The patient voices understanding of current disease status and treatment options and is in agreement with the current care plan. All questions were answered. The patient knows to call the clinic with any problems, questions or concerns. We can certainly see the patient much sooner if necessary    No orders of the defined types were placed in this encounter.    I spent {CHL ONC TIME VISIT - UEAVW:[id number]} counseling the patient face to face. The total time spent in the appointment was {CHL ONC TIME VISIT - GNFAO:[id number]}.  Izamar Linden L Geraldyn Shain, PA-C 06/24/23

## 2023-06-24 NOTE — Progress Notes (Deleted)
 [facility] OFFICE PROGRESS NOTE  Clements, Vyvyan, MD [phone] [name] [place] [address]  DIAGNOSIS: This is a very pleasant 43 year old Caucasian female referred to the clinic for elevated ferritin. She is heterozygous for hemochromatosis.   PRIOR THERAPY: None  CURRENT THERAPY: Reducing intake of iron rich food  INTERVAL HISTORY: [name] 43 y.o. female returns to the clinic today for follow-up visit.  The patient was referred to the clinic for elevated ferritin.  She is heterozygous for hemochromatosis.  However, generally and would like this patient's to not require treatment for hemochromatosis.  Her iron studies at her last appointment were elevated but her ferritin continue to downtrend.  Therefore we decided not to proceed with a phlebotomy at that time.  Recommended low iron diet and avoiding multivitamins.  She is here for 39-month follow-up check.  Overall she denies any major changes in her health.  She is healthy and exercises on a regular basis with running and CrossFit.  She denies any unusual fatigue.  She has an IUD so she does not have regular menstrual cycles.  She does not take any other medications on a regular basis.  She denies any hyperpigmentation or bronzing of the skin.  Denies liver disease.  She does drink over the recommended intake of alcohol and since her last appointment she has ***.  He also had been drinking smoothies that contain spinach and has ***reduced her intake of this.  She denies any other inflammatory or autoimmune disorders.  She denies any allergies.  Denies any fever, chills, or unexplained weight loss.  She denies any nausea, vomiting, diarrhea, skin infections, dysuria, cough, sore throat, jaundice, or itching.  She denies any unusual joint pain.  She is here today for evaluation and repeat blood work. MEDICAL HISTORY: Past Medical History:  Diagnosis Date   Anxiety state    unspec   Fracture    ankle   HLD  (hyperlipidemia)    Infection    uti   Pregnancy induced hypertension    Rheumatic fever    saw cardiologist at [facility] as a child for possible rheumatic fever    ALLERGIES:  has no known allergies.  MEDICATIONS:  Current Outpatient Medications  Medication Sig Dispense Refill   acetaminophen  (TYLENOL ) 500 MG tablet Take 1,000 mg by mouth every 6 (six) hours as needed for headache.     ibuprofen  (ADVIL ,MOTRIN ) 600 MG tablet Take 1 tablet (600 mg total) by mouth every 6 (six) hours as needed. 30 tablet 0   Prenatal Vit-Fe Fumarate-FA (PRENATAL MULTIVITAMIN) TABS Take 1 tablet by mouth at bedtime.     No current facility-administered medications for this visit.    SURGICAL HISTORY:  Past Surgical History:  Procedure Laterality Date   CESAREAN SECTION  02/23/2012   Procedure: CESAREAN SECTION;  Surgeon: Jeanmarie Millet, MD;  Location: [facility];  Service: Obstetrics;  Laterality: N/A;  Primary Cesarean Section   CESAREAN SECTION N/A 08/21/2013   Procedure: REPEAT CESAREAN SECTION;  Surgeon: Jeanmarie Millet, MD;  Location: [facility];  Service: Obstetrics;  Laterality: N/A;  REPEAT EDC 08/23/13   wisdom teeth removal      REVIEW OF SYSTEMS:   Review of Systems  Constitutional: Negative for appetite change, chills, fatigue, fever and unexpected weight change.  HENT:   Negative for mouth sores, nosebleeds, sore throat and trouble swallowing.   Eyes: Negative for eye problems and icterus.  Respiratory: Negative for  cough, hemoptysis, shortness of breath and wheezing.   Cardiovascular: Negative for chest pain and leg swelling.  Gastrointestinal: Negative for abdominal pain, constipation, diarrhea, nausea and vomiting.  Genitourinary: Negative for bladder incontinence, difficulty urinating, dysuria, frequency and hematuria.   Musculoskeletal: Negative for back pain, gait problem, neck pain and neck stiffness.  Skin: Negative for itching and rash.  Neurological: Negative for dizziness,  extremity weakness, gait problem, headaches, light-headedness and seizures.  Hematological: Negative for adenopathy. Does not bruise/bleed easily.  Psychiatric/Behavioral: Negative for confusion, depression and sleep disturbance. The patient is not nervous/anxious.     PHYSICAL EXAMINATION:  unknown if currently breastfeeding.  ECOG PERFORMANCE STATUS: {CHL ONC ECOG [id number]  Physical Exam  Constitutional: Oriented to person, place, and time and well-developed, well-nourished, and in no distress. No distress.  HENT:  Head: Normocephalic and atraumatic.  Mouth/Throat: Oropharynx is clear and moist. No oropharyngeal exudate.  Eyes: Conjunctivae are normal. Right eye exhibits no discharge. Left eye exhibits no discharge. No scleral icterus.  Neck: Normal range of motion. Neck supple.  Cardiovascular: Normal rate, regular rhythm, normal heart sounds and intact distal pulses.   Pulmonary/Chest: Effort normal and breath sounds normal. No respiratory distress. No wheezes. No rales.  Abdominal: Soft. Bowel sounds are normal. Exhibits no distension and no mass. There is no tenderness.  Musculoskeletal: Normal range of motion. Exhibits no edema.  Lymphadenopathy:    No cervical adenopathy.  Neurological: Alert and oriented to person, place, and time. Exhibits normal muscle tone. Gait normal. Coordination normal.  Skin: Skin is warm and dry. No rash noted. Not diaphoretic. No erythema. No pallor.  Psychiatric: Mood, memory and judgment normal.  Vitals reviewed.  LABORATORY DATA: Lab Results  Component Value Date   WBC 7.9 04/01/2023   HGB 12.8 04/01/2023   HCT 39.2 04/01/2023   MCV 100.0 04/01/2023   PLT 218 04/01/2023      Chemistry      Component Value Date/Time   NA 136 04/01/2023 1305   K 4.3 04/01/2023 1305   CL 101 04/01/2023 1305   CO2 31 04/01/2023 1305   BUN 13 04/01/2023 1305   CREATININE 0.87 04/01/2023 1305   CREATININE 0.67 12/10/2012 0919      Component Value  Date/Time   CALCIUM 9.6 04/01/2023 1305   ALKPHOS 49 04/01/2023 1305   AST 21 04/01/2023 1305   ALT 18 04/01/2023 1305   BILITOT 0.5 04/01/2023 1305       RADIOGRAPHIC STUDIES:  No results found.   ASSESSMENT/PLAN:  This is a very pleasant 43 year old Caucasian female referred to the clinic for elevated ferritin. She is heterozygous for hemochromatosis.    The patient had repeat labs today including CBC, CMP, ferritin, and iron studies.    Dr. Marguerita Shih explained at her prior appointment that in general patients heterozygous for hemochromatosis generally do not manifest symptoms of iron overload. Her CBC today shows ***. Her iron studies and ferritin show ***.    In general we recommend seeing her back for follow-up visit in 3**** months with repeat iron studies, ferritin, CBC, and CMP while cutting on any vitamins that contain iron.    However, if her ferritin comes back more elevated or concerning today, we may consider her for phlebotomy.***  Will send her a MyChart message once I have the results of her ferritin.   Would recommend that she cut back on any excessive iron rich food intake.  Additionally she was advised to cut back on alcohol use.  The patient was advised to call immediately if she has any concerning symptoms in the interval. The patient voices understanding of current disease status and treatment options and is in agreement with the current care plan. All questions were answered. The patient knows to call the clinic with any problems, questions or concerns. We can certainly see the patient much sooner if necessary    No orders of the defined types were placed in this encounter.    I spent {CHL ONC TIME VISIT - AOZHY:[id number]} counseling the patient face to face. The total time spent in the appointment was {CHL ONC TIME VISIT - XBMWU:[id number]}.  Myia Bergh L Nadia Torr, PA-C 06/24/23

## 2023-06-24 NOTE — Telephone Encounter (Signed)
 Rescheduled appointments per provider requested. The patient is aware of the changes made and is active on MyChart.

## 2023-06-27 ENCOUNTER — Inpatient Hospital Stay: Payer: 59

## 2023-06-27 ENCOUNTER — Inpatient Hospital Stay (HOSPITAL_BASED_OUTPATIENT_CLINIC_OR_DEPARTMENT_OTHER): Admitting: Physician Assistant

## 2023-06-27 ENCOUNTER — Inpatient Hospital Stay: Attending: Physician Assistant

## 2023-06-27 ENCOUNTER — Inpatient Hospital Stay: Payer: 59 | Admitting: Physician Assistant

## 2023-06-27 VITALS — BP 155/83 | HR 71 | Temp 97.6°F | Resp 14 | Wt 166.2 lb

## 2023-06-27 DIAGNOSIS — R7989 Other specified abnormal findings of blood chemistry: Secondary | ICD-10-CM

## 2023-06-27 LAB — CBC WITH DIFFERENTIAL (CANCER CENTER ONLY)
Abs Immature Granulocytes: 0.01 10*3/uL (ref 0.00–0.07)
Basophils Absolute: 0 10*3/uL (ref 0.0–0.1)
Basophils Relative: 0 %
Eosinophils Absolute: 0.1 10*3/uL (ref 0.0–0.5)
Eosinophils Relative: 2 %
HCT: 37.5 % (ref 36.0–46.0)
Hemoglobin: 12.9 g/dL (ref 12.0–15.0)
Immature Granulocytes: 0 %
Lymphocytes Relative: 20 %
Lymphs Abs: 1.5 10*3/uL (ref 0.7–4.0)
MCH: 33.3 pg (ref 26.0–34.0)
MCHC: 34.4 g/dL (ref 30.0–36.0)
MCV: 96.9 fL (ref 80.0–100.0)
Monocytes Absolute: 0.5 10*3/uL (ref 0.1–1.0)
Monocytes Relative: 7 %
Neutro Abs: 5.2 10*3/uL (ref 1.7–7.7)
Neutrophils Relative %: 71 %
Platelet Count: 220 10*3/uL (ref 150–400)
RBC: 3.87 MIL/uL (ref 3.87–5.11)
RDW: 12.3 % (ref 11.5–15.5)
WBC Count: 7.3 10*3/uL (ref 4.0–10.5)
nRBC: 0 % (ref 0.0–0.2)

## 2023-06-27 LAB — CMP (CANCER CENTER ONLY)
ALT: 20 U/L (ref 0–44)
AST: 18 U/L (ref 15–41)
Albumin: 4.9 g/dL (ref 3.5–5.0)
Alkaline Phosphatase: 58 U/L (ref 38–126)
Anion gap: 8 (ref 5–15)
BUN: 15 mg/dL (ref 6–20)
CO2: 29 mmol/L (ref 22–32)
Calcium: 9.6 mg/dL (ref 8.9–10.3)
Chloride: 99 mmol/L (ref 98–111)
Creatinine: 0.92 mg/dL (ref 0.44–1.00)
GFR, Estimated: >60 mL/min (ref >60)
Glucose, Bld: 93 mg/dL (ref 70–99)
Potassium: 4 mmol/L (ref 3.5–5.1)
Sodium: 136 mmol/L (ref 135–145)
Total Bilirubin: 0.6 mg/dL (ref 0.0–1.2)
Total Protein: 7.7 g/dL (ref 6.5–8.1)

## 2023-06-27 LAB — IRON AND IRON BINDING CAPACITY (CC-WL,HP ONLY)
Iron: 128 ug/dL (ref 28–170)
Saturation Ratios: 42 % — ABNORMAL HIGH (ref 10.4–31.8)
TIBC: 305 ug/dL (ref 250–450)
UIBC: 177 ug/dL (ref 148–442)

## 2023-06-28 LAB — FERRITIN: Ferritin: 392 ng/mL — ABNORMAL HIGH (ref 11–307)

## 2023-07-01 ENCOUNTER — Encounter: Payer: Self-pay | Admitting: Physician Assistant

## 2023-07-01 ENCOUNTER — Other Ambulatory Visit: Payer: Self-pay | Admitting: Physician Assistant

## 2023-07-02 ENCOUNTER — Other Ambulatory Visit: Payer: Self-pay | Admitting: Physician Assistant

## 2023-07-02 DIAGNOSIS — R7989 Other specified abnormal findings of blood chemistry: Secondary | ICD-10-CM

## 2023-10-24 ENCOUNTER — Telehealth: Payer: Self-pay | Admitting: Physician Assistant

## 2023-10-24 NOTE — Telephone Encounter (Signed)
 Scheduled appointments with the patient per IB message.

## 2023-11-11 NOTE — Progress Notes (Unsigned)
 [facility] OFFICE PROGRESS NOTE  Sun, Vyvyan, MD [phone] [address] [place] [address]  DIAGNOSIS: This is a very pleasant 43 year old Caucasian female referred to the clinic for elevated ferritin. She is heterozygous for hemochromatosis.   PRIOR THERAPY: None  CURRENT THERAPY: Reducing intake of iron rich food   INTERVAL HISTORY: [name] 43 y.o. female returns to the clinic today for a follow-up visit. The patient was referred to the clinic for elevated ferritin. She is heterozygous for hemochromatosis.  However, generally and would like this patient's to not require treatment for hemochromatosis. Her iron studies at her first appointment were elevated (she also had been taking iron supplements daily) but her ferritin continue to downtrend. Therefore, we decided not to proceed with a phlebotomy at that time. Recommended low iron diet and avoiding multivitamins.  She is here for 5-month follow-up check. She does still drink her spinach smoothly 5 days per week.   Overall she denies any major changes in her health.  She is healthy and exercises on a regular basis with running and CrossFit but she is doing more piliates now due to some back pain.  She has discontinued taking her iron supplements.  She also is trying to cut back on her alcohol intake.  She previously expressed she has had a challenging time with this due to her social circle also consuming alcohol. She has cut back to 9-10 drinks on the weekend. She also cut back on alcohol.   She is seeing her PCP next week.    She denies any unusual fatigue. She has an IUD so she does not have regular menstrual cycles. She does not take any other medications on a regular basis.  She denies any hyperpigmentation or bronzing of the skin.  Denies liver disease.  She denies any other inflammatory or autoimmune disorders.  She denies any allergies.  Denies any fever, chills, or unexplained weight loss.  Drinking IPA's  caused abdominal discomfort, bowel change, and nausea so she has cut back. She denies any nausea, vomiting, diarrhea, skin infections, dysuria, cough, sore throat, jaundice, or itching.  She denies any unusual joint pain.  She is here today for evaluation and repeat blood work.     MEDICAL HISTORY: Past Medical History:  Diagnosis Date   Anxiety state    unspec   Fracture    ankle   HLD (hyperlipidemia)    Infection    uti   Pregnancy induced hypertension    Rheumatic fever    saw cardiologist at [facility] as a child for possible rheumatic fever    ALLERGIES:  has no known allergies.  MEDICATIONS:  Current Outpatient Medications  Medication Sig Dispense Refill   acetaminophen  (TYLENOL ) 500 MG tablet Take 1,000 mg by mouth every 6 (six) hours as needed for headache.     ibuprofen  (ADVIL ,MOTRIN ) 600 MG tablet Take 1 tablet (600 mg total) by mouth every 6 (six) hours as needed. 30 tablet 0   Prenatal Vit-Fe Fumarate-FA (PRENATAL MULTIVITAMIN) TABS Take 1 tablet by mouth at bedtime.     No current facility-administered medications for this visit.    SURGICAL HISTORY:  Past Surgical History:  Procedure Laterality Date   CESAREAN SECTION  02/23/2012   Procedure: CESAREAN SECTION;  Surgeon: Lynwood FORBES Curlene PONCE, MD;  Location: [facility];  Service: Obstetrics;  Laterality: N/A;  Primary Cesarean Section   CESAREAN SECTION N/A 08/21/2013   Procedure: REPEAT CESAREAN SECTION;  Surgeon:  Lynwood FORBES Clubs II, MD;  Location: [facility];  Service: Obstetrics;  Laterality: N/A;  REPEAT EDC 08/23/13   wisdom teeth removal      REVIEW OF SYSTEMS:   Review of Systems  Constitutional: Negative for appetite change, chills, fatigue, fever and unexpected weight change.  HENT: Negative for mouth sores, nosebleeds, sore throat and trouble swallowing.   Eyes: Negative for eye problems and icterus.  Respiratory: Negative for cough, hemoptysis, shortness of breath and wheezing.   Cardiovascular: Negative for  chest pain and leg swelling.  Gastrointestinal: Negative for abdominal pain, constipation, diarrhea, nausea and vomiting.  Genitourinary: Negative for bladder incontinence, difficulty urinating, dysuria, frequency and hematuria.   Musculoskeletal: Negative for back pain, gait problem, neck pain and neck stiffness.  Skin: Negative for itching and rash.  Neurological: Negative for dizziness, extremity weakness, gait problem, headaches, light-headedness and seizures.  Hematological: Negative for adenopathy. Does not bruise/bleed easily.  Psychiatric/Behavioral: Negative for confusion, depression and sleep disturbance. The patient is not nervous/anxious.     PHYSICAL EXAMINATION:  Blood pressure (!) 131/91, pulse 68, temperature (!) 97.2 F (36.2 C), temperature source Temporal, resp. rate 17, height 5' 6 (1.676 m), weight 163 lb (73.9 kg), SpO2 100%, unknown if currently breastfeeding.  ECOG PERFORMANCE STATUS: 0  Physical Exam  Constitutional: Oriented to person, place, and time and well-developed, well-nourished, and in no distress.  HENT:  Head: Normocephalic and atraumatic.  Mouth/Throat: Oropharynx is clear and moist. No oropharyngeal exudate.  Eyes: Conjunctivae are normal. Right eye exhibits no discharge. Left eye exhibits no discharge. No scleral icterus.  Neck: Normal range of motion. Neck supple.  Cardiovascular: Normal rate, regular rhythm, normal heart sounds and intact distal pulses.   Pulmonary/Chest: Effort normal and breath sounds normal. No respiratory distress. No wheezes. No rales.  Abdominal: Soft. Bowel sounds are normal. Exhibits no distension and no mass. There is no tenderness.  Musculoskeletal: Normal range of motion. Exhibits no edema.  Lymphadenopathy:    No cervical adenopathy.  Neurological: Alert and oriented to person, place, and time. Exhibits normal muscle tone. Gait normal. Coordination normal.  Skin: Skin is warm and dry. No rash noted. Not diaphoretic.  No erythema. No pallor.  Psychiatric: Mood, memory and judgment normal.  Vitals reviewed.  LABORATORY DATA: Lab Results  Component Value Date   WBC 5.4 11/14/2023   HGB 13.1 11/14/2023   HCT 38.2 11/14/2023   MCV 94.8 11/14/2023   PLT 269 11/14/2023      Chemistry      Component Value Date/Time   NA 136 06/27/2023 1458   K 4.0 06/27/2023 1458   CL 99 06/27/2023 1458   CO2 29 06/27/2023 1458   BUN 15 06/27/2023 1458   CREATININE 0.92 06/27/2023 1458   CREATININE 0.67 12/10/2012 0919      Component Value Date/Time   CALCIUM 9.6 06/27/2023 1458   ALKPHOS 58 06/27/2023 1458   AST 18 06/27/2023 1458   ALT 20 06/27/2023 1458   BILITOT 0.6 06/27/2023 1458       RADIOGRAPHIC STUDIES:  No results found.   ASSESSMENT/PLAN:  This is a very pleasant 43 year old Caucasian female referred to the clinic for elevated ferritin. She is heterozygous for hemochromatosis.    The patient had repeat labs today including CBC, CMP, ferritin, and iron studies.    Dr. Sherrod explained at her prior appointment that in general patients heterozygous for hemochromatosis generally do not manifest symptoms of iron overload. Her CBC today is WNL.  Her iron studies and ferritin are pending.    I will call her or send her a MyChart message with the recommendation and follow-up instructions.  In the meantime she was instructed to continue to avoid taking any iron supplements.  She may want to consider cutting back on her iron rich food if her iron continues to be elevated with the smoothies.    We also discussed previously cutting back and alcohol cessation.    We will tentatively see her back for labs and follow up in 3 months.   The patient was advised to call immediately if she has any concerning symptoms in the interval. The patient voices understanding of current disease status and treatment options and is in agreement with the current care plan. All questions were answered. The patient knows  to call the clinic with any problems, questions or concerns. We can certainly see the patient much sooner if necessary    Orders Placed This Encounter  Procedures   CBC with Differential ([facility] Only)    Standing Status:   Future    Expected Date:   02/13/2024    Expiration Date:   11/13/2024   Ferritin    Standing Status:   Future    Expected Date:   02/13/2024    Expiration Date:   11/13/2024   Iron and Iron Binding Capacity ([facility] only)    Standing Status:   Future    Expected Date:   02/13/2024    Expiration Date:   11/13/2024     The total time spent in the appointment was 20-29 minutes  Eulis Salazar L Lindamarie Maclachlan, PA-C 11/14/23

## 2023-11-14 ENCOUNTER — Inpatient Hospital Stay: Attending: Physician Assistant

## 2023-11-14 ENCOUNTER — Inpatient Hospital Stay (HOSPITAL_BASED_OUTPATIENT_CLINIC_OR_DEPARTMENT_OTHER): Admitting: Physician Assistant

## 2023-11-14 VITALS — BP 131/91 | HR 68 | Temp 97.2°F | Resp 17 | Ht 66.0 in | Wt 163.0 lb

## 2023-11-14 DIAGNOSIS — R7989 Other specified abnormal findings of blood chemistry: Secondary | ICD-10-CM | POA: Diagnosis not present

## 2023-11-14 LAB — CBC WITH DIFFERENTIAL (CANCER CENTER ONLY)
Abs Immature Granulocytes: 0.01 K/uL (ref 0.00–0.07)
Basophils Absolute: 0 K/uL (ref 0.0–0.1)
Basophils Relative: 1 %
Eosinophils Absolute: 0.2 K/uL (ref 0.0–0.5)
Eosinophils Relative: 5 %
HCT: 38.2 % (ref 36.0–46.0)
Hemoglobin: 13.1 g/dL (ref 12.0–15.0)
Immature Granulocytes: 0 %
Lymphocytes Relative: 25 %
Lymphs Abs: 1.3 K/uL (ref 0.7–4.0)
MCH: 32.5 pg (ref 26.0–34.0)
MCHC: 34.3 g/dL (ref 30.0–36.0)
MCV: 94.8 fL (ref 80.0–100.0)
Monocytes Absolute: 0.4 K/uL (ref 0.1–1.0)
Monocytes Relative: 8 %
Neutro Abs: 3.3 K/uL (ref 1.7–7.7)
Neutrophils Relative %: 61 %
Platelet Count: 269 K/uL (ref 150–400)
RBC: 4.03 MIL/uL (ref 3.87–5.11)
RDW: 11.7 % (ref 11.5–15.5)
WBC Count: 5.4 K/uL (ref 4.0–10.5)
nRBC: 0 % (ref 0.0–0.2)

## 2023-11-14 LAB — IRON AND IRON BINDING CAPACITY (CC-WL,HP ONLY)
Iron: 153 ug/dL (ref 28–170)
Saturation Ratios: 55 % — ABNORMAL HIGH (ref 10.4–31.8)
TIBC: 280 ug/dL (ref 250–450)
UIBC: 127 ug/dL — ABNORMAL LOW (ref 148–442)

## 2023-11-14 LAB — FERRITIN: Ferritin: 440 ng/mL — ABNORMAL HIGH (ref 11–307)

## 2023-11-18 ENCOUNTER — Ambulatory Visit: Payer: Self-pay

## 2023-11-18 NOTE — Telephone Encounter (Signed)
 Spoke with patient regarding lab results. Per Cassie, PA, patient's ferritin remains elevated. Encouraged patient to reduce intake of iron-rich foods and alcohol. Patient reports she has stopped drinking smoothies with spinach and has improved with alcohol consumption. Instructed patient to call if any needs. Patient voiced understanding.

## 2024-02-13 ENCOUNTER — Inpatient Hospital Stay

## 2024-02-13 ENCOUNTER — Inpatient Hospital Stay: Attending: Physician Assistant | Admitting: Internal Medicine

## 2024-02-13 VITALS — BP 122/70 | HR 82 | Temp 97.7°F | Resp 17 | Ht 66.0 in | Wt 166.2 lb

## 2024-02-13 DIAGNOSIS — R7989 Other specified abnormal findings of blood chemistry: Secondary | ICD-10-CM | POA: Diagnosis not present

## 2024-02-13 DIAGNOSIS — G8929 Other chronic pain: Secondary | ICD-10-CM | POA: Insufficient documentation

## 2024-02-13 DIAGNOSIS — M542 Cervicalgia: Secondary | ICD-10-CM | POA: Insufficient documentation

## 2024-02-13 DIAGNOSIS — M549 Dorsalgia, unspecified: Secondary | ICD-10-CM | POA: Insufficient documentation

## 2024-02-13 LAB — HEPATITIS PANEL, ACUTE
HCV Ab: NONREACTIVE
Hep A IgM: NONREACTIVE
Hep B C IgM: NONREACTIVE
Hepatitis B Surface Ag: NONREACTIVE

## 2024-02-13 LAB — CBC WITH DIFFERENTIAL (CANCER CENTER ONLY)
Abs Immature Granulocytes: 0 K/uL (ref 0.00–0.07)
Basophils Absolute: 0.1 K/uL (ref 0.0–0.1)
Basophils Relative: 1 %
Eosinophils Absolute: 0.3 K/uL (ref 0.0–0.5)
Eosinophils Relative: 5 %
HCT: 39.9 % (ref 36.0–46.0)
Hemoglobin: 14 g/dL (ref 12.0–15.0)
Immature Granulocytes: 0 %
Lymphocytes Relative: 26 %
Lymphs Abs: 1.6 K/uL (ref 0.7–4.0)
MCH: 32.6 pg (ref 26.0–34.0)
MCHC: 35.1 g/dL (ref 30.0–36.0)
MCV: 93 fL (ref 80.0–100.0)
Monocytes Absolute: 0.5 K/uL (ref 0.1–1.0)
Monocytes Relative: 8 %
Neutro Abs: 3.7 K/uL (ref 1.7–7.7)
Neutrophils Relative %: 60 %
Platelet Count: 229 K/uL (ref 150–400)
RBC: 4.29 MIL/uL (ref 3.87–5.11)
RDW: 12.1 % (ref 11.5–15.5)
WBC Count: 6 K/uL (ref 4.0–10.5)
nRBC: 0 % (ref 0.0–0.2)

## 2024-02-13 LAB — IRON AND IRON BINDING CAPACITY (CC-WL,HP ONLY)
Iron: 162 ug/dL (ref 28–170)
Saturation Ratios: 51 % — ABNORMAL HIGH (ref 10.4–31.8)
TIBC: 319 ug/dL (ref 250–450)
UIBC: 157 ug/dL

## 2024-02-13 LAB — SEDIMENTATION RATE: Sed Rate: 2 mm/h (ref 0–22)

## 2024-02-13 LAB — C-REACTIVE PROTEIN: CRP: 0.5 mg/dL (ref <1.0)

## 2024-02-13 LAB — FERRITIN: Ferritin: 446 ng/mL — ABNORMAL HIGH (ref 11–307)

## 2024-02-13 NOTE — Progress Notes (Signed)
 [facility] [phone]   [phone]  OFFICE PROGRESS NOTE  Austin Nutley, MD [phone] [address] [place] [address]  DIAGNOSIS: Heterozygous hemochromatosis, H63D with elevated ferritin level  PRIOR THERAPY: None  CURRENT THERAPY: Dietary restriction with low iron supplement  INTERVAL HISTORY: [name] 43 y.o. female returns to the clinic today for follow-up visit. Discussed the use of AI scribe software for clinical note transcription with the patient, who gave verbal consent to proceed.  History of Present Illness [name] is a 43 year old female with heterozygous H63D hemochromatosis and persistently elevated ferritin who presents for hematology follow-up and monitoring of iron studies.  She is monitored for heterozygous H63D hemochromatosis with persistently elevated ferritin. She denies gastrointestinal or colon symptoms. She expresses concern regarding factors contributing to elevated ferritin, particularly inflammation, and has implemented dietary and lifestyle modifications to manage iron levels.  She reports several months of back, hip, and neck pain, which she attributes to a prior herniated disc sustained eight years ago. She participates in physical therapy and chiropractic care with partial relief. The pain is aggravated but does not limit daily activities. She uses ibuprofen  almost daily, primarily at night, for pain control. She has not undergone rheumatologic evaluation. She denies fevers, weight loss, or night sweats.  Over the past three months, she has eliminated red meat, discontinued iron-containing supplements and spinach smoothies, and significantly reduced alcohol intake. She is amenorrheic with an IUD in place. She inquires about the potential benefit of resuming menses to lower iron levels and is aware that blood donation is not permitted due to her carrier status. She expresses concern about the  long-term implications of elevated ferritin and the risk of organ damage, referencing a family history of homozygous hemochromatosis with liver involvement in her grandmother. She seeks reassurance and guidance regarding ongoing management and monitoring.     MEDICAL HISTORY: Past Medical History:  Diagnosis Date   Anxiety state    unspec   Fracture    ankle   HLD (hyperlipidemia)    Infection    uti   Pregnancy induced hypertension    Rheumatic fever    saw cardiologist at [facility] as a child for possible rheumatic fever    ALLERGIES:  has no known allergies.  MEDICATIONS:  Current Outpatient Medications  Medication Sig Dispense Refill   acetaminophen  (TYLENOL ) 500 MG tablet Take 1,000 mg by mouth every 6 (six) hours as needed for headache.     ibuprofen  (ADVIL ,MOTRIN ) 600 MG tablet Take 1 tablet (600 mg total) by mouth every 6 (six) hours as needed. 30 tablet 0   Prenatal Vit-Fe Fumarate-FA (PRENATAL MULTIVITAMIN) TABS Take 1 tablet by mouth at bedtime.     No current facility-administered medications for this visit.    SURGICAL HISTORY:  Past Surgical History:  Procedure Laterality Date   CESAREAN SECTION  02/23/2012   Procedure: CESAREAN SECTION;  Surgeon: Lynwood FORBES Curlene PONCE, MD;  Location: [facility];  Service: Obstetrics;  Laterality: N/A;  Primary Cesarean Section   CESAREAN SECTION N/A 08/21/2013   Procedure: REPEAT CESAREAN SECTION;  Surgeon: Lynwood FORBES Curlene PONCE, MD;  Location: [facility];  Service: Obstetrics;  Laterality: N/A;  REPEAT EDC 08/23/13   wisdom teeth removal      REVIEW OF SYSTEMS:  Constitutional: positive for fatigue Eyes: negative Ears, nose, mouth, throat, and face: negative Respiratory: negative Cardiovascular: negative Gastrointestinal: negative Genitourinary:negative Integument/breast: negative Hematologic/lymphatic: negative Musculoskeletal:positive for arthralgias  Neurological: negative Behavioral/Psych: negative Endocrine:  negative Allergic/Immunologic: negative   PHYSICAL EXAMINATION: General appearance: alert, cooperative, fatigued, and no distress Head: Normocephalic, without obvious abnormality, atraumatic Neck: no adenopathy, no JVD, supple, symmetrical, trachea midline, and thyroid not enlarged, symmetric, no tenderness/mass/nodules Lymph nodes: Cervical, supraclavicular, and axillary nodes normal. Resp: clear to auscultation bilaterally Back: symmetric, no curvature. ROM normal. No CVA tenderness. Cardio: regular rate and rhythm, S1, S2 normal, no murmur, click, rub or gallop GI: soft, non-tender; bowel sounds normal; no masses,  no organomegaly Extremities: extremities normal, atraumatic, no cyanosis or edema Neurologic: Alert and oriented X 3, normal strength and tone. Normal symmetric reflexes. Normal coordination and gait  ECOG PERFORMANCE STATUS: 1 - Symptomatic but completely ambulatory  Blood pressure 122/70, pulse 82, temperature 97.7 F (36.5 C), temperature source Temporal, resp. rate 17, height 5' 6 (1.676 m), weight 166 lb 3.2 oz (75.4 kg), SpO2 99%, unknown if currently breastfeeding.  LABORATORY DATA: Lab Results  Component Value Date   WBC 6.0 02/13/2024   HGB 14.0 02/13/2024   HCT 39.9 02/13/2024   MCV 93.0 02/13/2024   PLT 229 02/13/2024      Chemistry      Component Value Date/Time   NA 136 06/27/2023 1458   K 4.0 06/27/2023 1458   CL 99 06/27/2023 1458   CO2 29 06/27/2023 1458   BUN 15 06/27/2023 1458   CREATININE 0.92 06/27/2023 1458   CREATININE 0.67 12/10/2012 0919      Component Value Date/Time   CALCIUM 9.6 06/27/2023 1458   ALKPHOS 58 06/27/2023 1458   AST 18 06/27/2023 1458   ALT 20 06/27/2023 1458   BILITOT 0.6 06/27/2023 1458       RADIOGRAPHIC STUDIES: No results found.  ASSESSMENT AND PLAN: This is a very pleasant 43 years old white female with Heterozygous hemochromatosis, H63D with elevated ferritin level.  She is currently on observation with  iron dietary and supplement restriction. Assessment and Plan Assessment & Plan Heterozygous hemochromatosis H63D with elevated ferritin She is a carrier of a single H63D gene mutation with persistently elevated ferritin. This genotype confers a low risk of iron overload and organ damage compared to homozygous mutations. She remains asymptomatic from an iron overload perspective, with normal liver enzymes. - Ordered iron studies and liver function tests to monitor iron status and hepatic function. - Advised avoidance of iron supplements and limitation of dietary iron intake, including red meat and spinach. - Recommended significant reduction or elimination of alcohol consumption to protect hepatic health. - Advised avoidance of excessive acetaminophen  and NSAIDs to minimize hepatic and renal risk. - Provided education regarding low risk of organ damage with heterozygous H63D mutation and that regular phlebotomy is not indicated unless ferritin is extremely high. - Advised that blood donation is contraindicated due to carrier status. - Discussed that menstrual bleeding, if present, may lower iron levels; no intervention on IUD is recommended at this time. - Scheduled follow-up in three months for ongoing monitoring.  Chronic back and neck pain with history of lumbar disc herniation She has chronic musculoskeletal back and neck pain with prior lumbar disc herniation and disc removal. Symptoms have persisted for several months but do not significantly limit her activities. She is managed with physical therapy and chiropractic care, which have been beneficial. Further evaluation is warranted.  - Ordered ESR, CRP, thyroid function tests, and rheumatologic markers to evaluate for inflammatory or rheumatologic etiologies. - Advised continuation of physical therapy. - Discussed analgesic options, recommending acetaminophen   as preferred over NSAIDs, with caution to avoid excessive use of any analgesic. -  Advised avoidance of excessive NSAIDs due to potential adverse effects. The patient was advised to call immediately if she has any other concerning symptoms in the interval. The patient voices understanding of current disease status and treatment options and is in agreement with the current care plan.  All questions were answered. The patient knows to call the clinic with any problems, questions or concerns. We can certainly see the patient much sooner if necessary. The total time spent in the appointment was 30 minutes including review of chart and various tests results, discussions about plan of care and coordination of care plan .   Disclaimer: This note was dictated with voice recognition software. Similar sounding words can inadvertently be transcribed and may not be corrected upon review.

## 2024-02-14 LAB — RHEUMATOID FACTOR: Rheumatoid fact SerPl-aCnc: <10 [IU]/mL (ref <14.0)

## 2024-02-15 LAB — FANA STAINING PATTERNS: Speckled Pattern: 1:80 {titer}

## 2024-02-15 LAB — ANTINUCLEAR ANTIBODIES, IFA: ANA Ab, IFA: POSITIVE — AB

## 2024-05-14 ENCOUNTER — Inpatient Hospital Stay: Admitting: Internal Medicine

## 2024-05-14 ENCOUNTER — Inpatient Hospital Stay: Attending: Physician Assistant
